# Patient Record
Sex: Male | Born: 1975 | Race: White | Hispanic: No | Marital: Married | Smoking: Current every day smoker
Health system: Southern US, Community
[De-identification: ages and names within clinical notes are randomized; demographics above are authoritative.]

## PROBLEM LIST (undated history)

## (undated) DIAGNOSIS — F419 Anxiety disorder, unspecified: Secondary | ICD-10-CM

## (undated) DIAGNOSIS — IMO0002 Reserved for concepts with insufficient information to code with codable children: Secondary | ICD-10-CM

## (undated) DIAGNOSIS — IMO0001 Reserved for inherently not codable concepts without codable children: Secondary | ICD-10-CM

## (undated) DIAGNOSIS — I10 Essential (primary) hypertension: Secondary | ICD-10-CM

## (undated) DIAGNOSIS — F17200 Nicotine dependence, unspecified, uncomplicated: Secondary | ICD-10-CM

## (undated) DIAGNOSIS — T7840XA Allergy, unspecified, initial encounter: Secondary | ICD-10-CM

## (undated) HISTORY — DX: Essential (primary) hypertension: I10

## (undated) HISTORY — DX: Nicotine dependence, unspecified, uncomplicated: F17.200

## (undated) HISTORY — DX: Allergy, unspecified, initial encounter: T78.40XA

## (undated) HISTORY — DX: Reserved for inherently not codable concepts without codable children: IMO0001

## (undated) HISTORY — PX: NO PAST SURGERIES: SHX2092

## (undated) HISTORY — DX: Anxiety disorder, unspecified: F41.9

## (undated) HISTORY — DX: Reserved for concepts with insufficient information to code with codable children: IMO0002

## (undated) NOTE — *Deleted (*Deleted)
Clinical Summary Curtis Snow is a 44 y.o.male  1. Palpitations/Chest pains   2. HTN    Past Medical History:  Diagnosis Date  . Anxiety   . DDD (degenerative disc disease)    saw Dr. Eduard Clos  . Hypertension   . Smoking      Allergies  Allergen Reactions  . Penicillins Shortness Of Breath, Nausea And Vomiting and Rash    Did it involve swelling of the face/tongue/throat, SOB, or low BP? Yes Did it involve sudden or severe rash/hives, skin peeling, or any reaction on the inside of your mouth or nose? Yes Did you need to seek medical attention at a hospital or doctor's office? Yes When did it last happen?childhood allergy If all above answers are "NO", may proceed with cephalosporin use.      Current Outpatient Medications  Medication Sig Dispense Refill  . ibuprofen (ADVIL) 200 MG tablet Take 400 mg by mouth every 6 (six) hours as needed for headache or moderate pain.    Marland Kitchen losartan (COZAAR) 25 MG tablet Take 1 tablet by mouth once daily 90 tablet 0  . ondansetron (ZOFRAN) 4 MG tablet Take 1 tablet (4 mg total) by mouth every 6 (six) hours. 12 tablet 0   No current facility-administered medications for this visit.     Past Surgical History:  Procedure Laterality Date  . COLONOSCOPY N/A 05/20/2019   Procedure: COLONOSCOPY;  Surgeon: Corbin Ade, MD;  Location: [facility];  Service: Endoscopy;  Laterality: N/A;  1:30pm  . NO PAST SURGERIES    . POLYPECTOMY  05/20/2019   Procedure: POLYPECTOMY;  Surgeon: Corbin Ade, MD;  Location: [facility];  Service: Endoscopy;;  hepatic flexure     Allergies  Allergen Reactions  . Penicillins Shortness Of Breath, Nausea And Vomiting and Rash    Did it involve swelling of the face/tongue/throat, SOB, or low BP? Yes Did it involve sudden or severe rash/hives, skin peeling, or any reaction on the inside of your mouth or nose? Yes Did you need to seek medical attention at a hospital or doctor's office? Yes  When did it last happen?childhood allergy If all above answers are "NO", may proceed with cephalosporin use.       Family History  Problem Relation Age of Onset  . Arthritis Father   . Hypertension Father   . Hyperlipidemia Father   . Asthma Son   . COPD Maternal Grandmother   . Colon cancer Maternal Grandfather   . Heart disease Paternal Grandfather   . Colon cancer Maternal Aunt   . Breast cancer Maternal Aunt      Social History Curtis Snow reports that he has been smoking cigarettes. He has been smoking about 1.00 pack per day. He has never used smokeless tobacco. Curtis Snow reports no history of alcohol use.   Review of Systems CONSTITUTIONAL: No weight loss, fever, chills, weakness or fatigue.  HEENT: Eyes: No visual loss, blurred vision, double vision or yellow sclerae.No hearing loss, sneezing, congestion, runny nose or sore throat.  SKIN: No rash or itching.  CARDIOVASCULAR:  RESPIRATORY: No shortness of breath, cough or sputum.  GASTROINTESTINAL: No anorexia, nausea, vomiting or diarrhea. No abdominal pain or blood.  GENITOURINARY: No burning on urination, no polyuria NEUROLOGICAL: No headache, dizziness, syncope, paralysis, ataxia, numbness or tingling in the extremities. No change in bowel or bladder control.  MUSCULOSKELETAL: No muscle, back pain, joint pain or stiffness.  LYMPHATICS: No enlarged nodes. No history  of splenectomy.  PSYCHIATRIC: No history of depression or anxiety.  ENDOCRINOLOGIC: No reports of sweating, cold or heat intolerance. No polyuria or polydipsia.  Marland Kitchen   Physical Examination There were no vitals filed for this visit. There were no vitals filed for this visit.  Gen: resting comfortably, no acute distress HEENT: no scleral icterus, pupils equal round and reactive, no palptable cervical adenopathy,  CV Resp: Clear to auscultation bilaterally GI: abdomen is soft, non-tender, non-distended, normal bowel sounds, no  hepatosplenomegaly MSK: extremities are warm, no edema.  Skin: warm, no rash Neuro:  no focal deficits Psych: appropriate affect   Diagnostic Studies     Assessment and Plan        Antoine Poche, M.D., F.A.C.C.

---

## 2001-03-11 ENCOUNTER — Emergency Department (HOSPITAL_COMMUNITY): Admission: EM | Admit: 2001-03-11 | Discharge: 2001-03-11 | Payer: Self-pay | Admitting: Emergency Medicine

## 2001-11-19 ENCOUNTER — Encounter: Payer: Self-pay | Admitting: *Deleted

## 2001-11-19 ENCOUNTER — Emergency Department (HOSPITAL_COMMUNITY): Admission: EM | Admit: 2001-11-19 | Discharge: 2001-11-19 | Payer: Self-pay | Admitting: *Deleted

## 2003-02-06 ENCOUNTER — Encounter: Payer: Self-pay | Admitting: Emergency Medicine

## 2003-02-06 ENCOUNTER — Emergency Department (HOSPITAL_COMMUNITY): Admission: EM | Admit: 2003-02-06 | Discharge: 2003-02-06 | Payer: Self-pay | Admitting: Emergency Medicine

## 2003-03-05 ENCOUNTER — Ambulatory Visit (HOSPITAL_COMMUNITY): Admission: RE | Admit: 2003-03-05 | Discharge: 2003-03-05 | Payer: Self-pay | Admitting: Preventative Medicine

## 2004-04-28 ENCOUNTER — Ambulatory Visit: Payer: Self-pay | Admitting: Internal Medicine

## 2004-05-06 ENCOUNTER — Ambulatory Visit (HOSPITAL_COMMUNITY): Admission: RE | Admit: 2004-05-06 | Discharge: 2004-05-06 | Payer: Self-pay | Admitting: Internal Medicine

## 2005-11-09 ENCOUNTER — Emergency Department (HOSPITAL_COMMUNITY): Admission: EM | Admit: 2005-11-09 | Discharge: 2005-11-09 | Payer: Self-pay | Admitting: Emergency Medicine

## 2006-09-11 ENCOUNTER — Emergency Department (HOSPITAL_COMMUNITY): Admission: EM | Admit: 2006-09-11 | Discharge: 2006-09-11 | Payer: Self-pay | Admitting: Emergency Medicine

## 2007-10-05 ENCOUNTER — Emergency Department (HOSPITAL_COMMUNITY): Admission: EM | Admit: 2007-10-05 | Discharge: 2007-10-05 | Payer: Self-pay | Admitting: Emergency Medicine

## 2008-02-13 ENCOUNTER — Emergency Department (HOSPITAL_COMMUNITY): Admission: EM | Admit: 2008-02-13 | Discharge: 2008-02-13 | Payer: Self-pay | Admitting: Emergency Medicine

## 2008-05-25 ENCOUNTER — Emergency Department (HOSPITAL_COMMUNITY): Admission: EM | Admit: 2008-05-25 | Discharge: 2008-05-25 | Payer: Self-pay | Admitting: General Surgery

## 2009-06-22 ENCOUNTER — Emergency Department (HOSPITAL_COMMUNITY): Admission: EM | Admit: 2009-06-22 | Discharge: 2009-06-23 | Payer: Self-pay | Admitting: Emergency Medicine

## 2010-08-15 LAB — DIFFERENTIAL
Eosinophils Relative: 6 % — ABNORMAL HIGH (ref 0–5)
Lymphocytes Relative: 25 % (ref 12–46)
Lymphs Abs: 1.9 10*3/uL (ref 0.7–4.0)
Monocytes Absolute: 0.5 10*3/uL (ref 0.1–1.0)
Neutro Abs: 4.8 10*3/uL (ref 1.7–7.7)

## 2010-08-15 LAB — CBC
HCT: 45.4 % (ref 39.0–52.0)
Hemoglobin: 15.3 g/dL (ref 13.0–17.0)
WBC: 7.7 10*3/uL (ref 4.0–10.5)

## 2010-08-15 LAB — BASIC METABOLIC PANEL
GFR calc Af Amer: >60 mL/min (ref >60)
GFR calc non Af Amer: >60 mL/min (ref >60)
Potassium: 3.3 mEq/L — ABNORMAL LOW (ref 3.5–5.1)
Sodium: 140 mEq/L (ref 135–145)

## 2010-08-15 LAB — POCT CARDIAC MARKERS
CKMB, poc: <1 ng/mL — ABNORMAL LOW (ref 1.0–8.0)
Troponin i, poc: <0.05 ng/mL (ref 0.00–0.09)

## 2010-09-16 NOTE — Consult Note (Signed)
NAME:  Curtis Snow, Curtis Snow            ACCOUNT NO.:  [id number]   MEDICAL RECORD NO.:  [id number]          PATIENT TYPE:   LOCATION:                                FACILITY:  [facility]   PHYSICIAN:  R. Roetta Sessions, M.D. DATE OF BIRTH:  [birth year]   DATE OF CONSULTATION:  DATE OF DISCHARGE:                                   CONSULTATION   GASTROINTESTINAL CONSULT:   REQUESTING PHYSICIAN:  Dr. Ishmael Holter. McInnis.   REASON FOR CONSULTATION:  Persistent diarrhea.   HISTORY OF PRESENT ILLNESS:  Curtis Snow is a 35 year old Caucasian male  who reports a 3- to 74-month history of persistent diarrhea.  He notes  between 2 and 7 episodes of loose watery to semiformed stools per day every  day.  He also has noticed intermittent deep red blood mixed in with the  stool as well.  He denies any known history of external hemorrhoids.  He  denies any melena or mucus in the stool.  He also is complaining of right  mid-abdominal pain which has been intermittent for 4 years now and denies  any nausea or vomiting, denies any fever or chills, denies any history of  constipation, denies any recent antibiotic use, foreign travel, new pets, or  new medications.  He did take 1 dose of prednisone for his left shoulder  pain, although this was discontinued.  He denies any history of heartburn,  indigestion, dysphagia or odynophagia.   PAST MEDICAL HISTORY:  Left shoulder injury.   CURRENT MEDICATIONS:  Denies any.   ALLERGIES:  PENICILLIN, which causes hives; PREDNISONE, which causes  palpitations.   FAMILY HISTORY:  No first-degree relatives with colorectal carcinoma or  inflammatory bowel disease.  He does have a maternal grandfather with colon  cancer diagnosed in his 23s.  Mother, age 47, and father, age 27, are both  relatively healthy except for father with coronary artery disease.  He has a  healthy sister and brother.   SOCIAL HISTORY:  Curtis Snow is currently divorced.  His father and brother  live with him.  He has one 60-year-old son who is healthy.  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  He reports an 18-pack-year  tobacco use history, reports a 1- to 2-year heavy alcohol history while he  was in the [employer].  He denies any current drug use.   REVIEW OF SYSTEMS:  CONSTITUTIONAL:  He reports a 10-pound weight loss over  the last 4 months, however, he has gradually begun to gain this weight back  per his report.  He denies any fever or chills.  Appetite is good.  Denies  any early satiety.  ENDOCRINE:  Denies any history of diabetes mellitus or  thyroid disease.  GI:  See HPI.   PHYSICAL EXAM:  VITAL SIGNS:  Weight 123 pounds.  Height 74 inches.  Temperature 98.1, blood pressure 100/66, pulse 88.  GENERAL:  Curtis Snow is a 35 year old well-developed, well-nourished  Caucasian male in no acute distress.  He is alert, oriented, pleasant and  cooperative.  HEENT:  Sclerae clear and nonicteric.  Conjunctivae  are pink.  Oropharynx is  pink and moist without any lesion.  NECK:  Neck is supple without mass or thyromegaly.  CHEST:  Heart:  Regular rate and rhythm with normal S1 and S2, without any  murmurs, rubs, or gallops.  Lungs are clear to auscultation bilaterally.  ABDOMEN:  Abdomen flat with positive bowel sounds x4.  No bruits  auscultated.  Soft, nontender, except for mild tenderness just right at the  umbilicus on deep palpation, no McBurney's point tenderness, no rebound  tenderness or guarding, no palpable hepatosplenomegaly or mass.  RECTAL:  No external lesions visualized.  Good sphincter tone.  No internal  masses palpated.  There was a small amount of light brown stool obtained  from the vault which is Hemoccult-negative.  EXTREMITIES:  Pedal pulses 2+ bilaterally.  No edema.  SKIN:  Skin pink, warm and dry without rash or jaundice.   IMPRESSION:  Curtis Snow is a 35 year old Caucasian male with a 31-month  history of persistent watery loose  diarrhea, anywhere between 2 and 7  episodes per day every day.  He also has noted intermittent hematochezia  with the diarrhea as well.  He also noted intermittent right mid-abdominal  pain.  We should rule out inflammatory bowel disease in Curtis Snow's case.  Otherwise, findings are consistent with irritable bowel syndrome, although  he notes no problems with diarrhea up until this time.  We should also rule  out infectious etiologies.   RECOMMENDATIONS:  1.  We will schedule colonoscopy with Dr. Jonathon Bellows in the near      future.  I have discussed the procedure including risks and benefits      that include, but are not limited to, bleeding, fissure, perforation or      drug reaction; he agrees with the plan and consent will be obtained.  2.  Labs today to include CBC, sed rate, CMP and a full set of stool      studies.  3.  Rx was given for Levsin sublingual to use up to t.i.d. during abdominal      cramping and diarrhea, #60 with no refills.  4.  Further recommendations pending colonoscopy.   COMMENT:  We would like to thank Dr. Renard Matter for allowing [place] to participate  in the care of Curtis Snow.     Kand   KC/MEDQ  D:  04/28/2004  T:  04/28/2004  Job:  [id number]   cc:   Angus G. Renard Matter, MD  [address]  [place]  [place] [address]  Fax: [phone]

## 2011-01-30 LAB — URINALYSIS, ROUTINE W REFLEX MICROSCOPIC
Ketones, ur: NEGATIVE
Nitrite: NEGATIVE
Specific Gravity, Urine: 1.01
Urobilinogen, UA: 0.2
pH: 6

## 2011-01-30 LAB — CBC
Hemoglobin: 15.5
MCHC: 34.3
Platelets: 206
RDW: 12.8

## 2011-06-16 ENCOUNTER — Ambulatory Visit: Payer: 59 | Attending: Otolaryngology | Admitting: Sleep Medicine

## 2011-06-16 DIAGNOSIS — G4733 Obstructive sleep apnea (adult) (pediatric): Secondary | ICD-10-CM | POA: Insufficient documentation

## 2011-06-16 DIAGNOSIS — G4730 Sleep apnea, unspecified: Secondary | ICD-10-CM

## 2011-06-24 NOTE — Procedures (Signed)
NAME:  Curtis Snow, Curtis Snow            ACCOUNT NO.:  [id number]  MEDICAL RECORD NO.:  [id number]          PATIENT TYPE:  OUT  LOCATION:  SLEEP LAB                     FACILITY:  [facility]  PHYSICIAN:  Jory Tanguma A. Gerilyn Pilgrim, M.D. DATE OF BIRTH:  07-27-1975  DATE OF STUDY:                           NOCTURNAL POLYSOMNOGRAM  REFERRING PHYSICIAN:  Antony Contras, MD  INDICATION:  A 36 year old man, who presents with fatigue and snoring. The study is being done to evaluate for obstructive sleep apnea syndrome.  MEDICATIONS:  None.  EPWORTH SLEEPINESS SCALE:  5.  BMI:  24.  ARCHITECTURAL SUMMARY:  The total recording time is 380 minutes.  Sleep efficiency 89%.  Sleep latency 31 minute.  REM latency 65 minutes. Stage N1 5%, N2 62%, N3 10%, and REM sleep 22%.  RESPIRATORY SUMMARY:  Baseline oxygen saturation is 96, lowest saturation 87 during non-REM sleep.  Diagnostic AHI 7 and RDI 8.  LIMB MOVEMENT SUMMARY:  PLM index 2.  ELECTROCARDIOGRAM SUMMARY:  Average heart rate is 68 with no significant dysrhythmias observed.  IMPRESSION:  Mild obstructive sleep apnea syndrome, not requiring positive pressure therapy.  Thanks for this referral.     Vienne Corcoran A. Gerilyn Pilgrim, M.D.    KAD/MEDQ  D:  06/24/2011 12:53:36  T:  06/24/2011 13:32:45  Job:  [id number]

## 2011-06-27 ENCOUNTER — Encounter (HOSPITAL_COMMUNITY): Payer: Self-pay | Admitting: *Deleted

## 2011-06-27 ENCOUNTER — Emergency Department (HOSPITAL_COMMUNITY)
Admission: EM | Admit: 2011-06-27 | Discharge: 2011-06-27 | Disposition: A | Discharge status: Home Health | Payer: 59 | Attending: Emergency Medicine | Admitting: Emergency Medicine

## 2011-06-27 DIAGNOSIS — F17200 Nicotine dependence, unspecified, uncomplicated: Secondary | ICD-10-CM | POA: Insufficient documentation

## 2011-06-27 DIAGNOSIS — M79609 Pain in unspecified limb: Secondary | ICD-10-CM | POA: Insufficient documentation

## 2011-06-27 DIAGNOSIS — M79673 Pain in unspecified foot: Secondary | ICD-10-CM

## 2011-06-27 NOTE — ED Notes (Signed)
Pt reports pain to rt heel for the past couple of months, reports pain has gotten worse

## 2011-06-27 NOTE — Discharge Instructions (Signed)
Use motrin as needed--follow up with a podiatrist

## 2011-06-27 NOTE — ED Provider Notes (Signed)
History    This chart was scribed for Curtis Baker, MD, MD by Smitty Pluck. The patient was seen in room [facility]19 and the patient's care was started at 10:30PM.   CSN: [id number]  Arrival date & time 06/27/11  2219   None     Chief Complaint  Patient presents with  . Foot Pain    (Consider location/radiation/quality/duration/timing/severity/associated sxs/prior treatment) Patient is a 36 y.o. male presenting with lower extremity pain. The history is provided by the patient.  Foot Pain   Curtis Snow is a 36 y.o. male who presents to the Emergency Department complaining of intermittent moderate right heel pain onset 2-3 months ago. There is no radiation of pain. Pt reports that his job involves standing and he has to wear boots. Pt reports the pain is alleviated by resting while not at work. Pain is aggravated while at work standing and moving. Pt has not taken much medicine for pain besides an aspirin with no relief. Pt denies diabetes, HTN or any other health problems.   History reviewed. No pertinent past medical history.  History reviewed. No pertinent past surgical history.  No family history on file.  History  Substance Use Topics  . Smoking status: Current Everyday Smoker  . Smokeless tobacco: Not on file  . Alcohol Use: No      Review of Systems  All other systems reviewed and are negative.   10 Systems reviewed and are negative for acute change except as noted in the HPI.  Allergies  Penicillins  Home Medications  No current outpatient prescriptions on file.  BP 151/94  Pulse 86  Temp(Src) 97.7 F (36.5 C) (Oral)  Resp 20  Ht 6\' 2"  (1.88 m)  Wt 185 lb (83.915 kg)  BMI 23.75 kg/m2  SpO2 100%  Physical Exam  Nursing note and vitals reviewed. Constitutional: He is oriented to person, place, and time. He appears well-developed and well-nourished. No distress.  HENT:  Head: Normocephalic and atraumatic.  Eyes: EOM are normal. Pupils are equal,  round, and reactive to light.  Neck: Normal range of motion. Neck supple. No tracheal deviation present.  Cardiovascular: Normal rate, regular rhythm and normal heart sounds.   Pulmonary/Chest: Effort normal. No respiratory distress.  Abdominal: Soft. He exhibits no distension.  Musculoskeletal: Normal range of motion.       Right heel tenderness  flexion and extension nl with pain  no bruising or erythema Skin intact Non-tender on achilles tendon   Neurological: He is alert and oriented to person, place, and time.  Skin: Skin is warm and dry.  Psychiatric: He has a normal mood and affect. His behavior is normal.    ED Course  Procedures (including critical care time) DIAGNOSTIC STUDIES: Oxygen Saturation is 100% on room air, normal by my interpretation.    COORDINATION OF CARE: 10:40PM EDp discusses pt ED treatment course with pt and future treatment course.   Labs Reviewed - No data to display No results found.   No diagnosis found.    MDM  I personally performed the services described in this documentation, which was scribed in my presence. The recorded information has been reviewed and considered.          Curtis Baker, MD 06/27/11 2242

## 2011-10-05 ENCOUNTER — Emergency Department (HOSPITAL_COMMUNITY)
Admission: EM | Admit: 2011-10-05 | Discharge: 2011-10-05 | Disposition: A | Discharge status: Home / Self Care | Payer: 59 | Attending: Emergency Medicine | Admitting: Emergency Medicine

## 2011-10-05 ENCOUNTER — Encounter (HOSPITAL_COMMUNITY): Payer: Self-pay

## 2011-10-05 DIAGNOSIS — R002 Palpitations: Secondary | ICD-10-CM | POA: Insufficient documentation

## 2011-10-05 DIAGNOSIS — R0789 Other chest pain: Secondary | ICD-10-CM | POA: Insufficient documentation

## 2011-10-05 DIAGNOSIS — F17200 Nicotine dependence, unspecified, uncomplicated: Secondary | ICD-10-CM | POA: Insufficient documentation

## 2011-10-05 NOTE — ED Provider Notes (Signed)
History   This chart was scribed for Doug Sou, MD by Brooks Sailors. The patient was seen in room [facility]11/[facility]. Patient's care was started at 1158.   CSN: [id number]  Arrival date & time 10/05/11  1158   First MD Initiated Contact with Patient 10/05/11 1337      Chief Complaint  Patient presents with  . Chest Pain    (Consider location/radiation/quality/duration/timing/severity/associated sxs/prior treatment) HPI  Curtis Snow is a 37 y.o. male who presents to the Emergency Department complaining of constant chest pain onset over a year. Pt says it started "not very severe" with occasional heart "skipping a beat". Pt says it has become more severe this week with , with the longest episode of skipped heartbeats lasting 5 seconds. He also reports left-sided. Sternal chest pain which has been constant for approximately one year no treatment prior to coming here. Nothing makes symptoms better or worse symptoms nonexertional. No shortness of breath nausea or sweatiness. Admits to frequent caffeinated beverages episodes of sharper pain. Says the pain feels like someone is stepping on him. Pt says he may have high cholesterol, recommended to take fish oil pills and is a smoker. Father has arrythmia. No history    History reviewed. No pertinent past medical history. Questionable hypercholesterolemia History reviewed. No pertinent past surgical history.  No family history on file. Family his Coronary artery disease in grandfather History  Substance Use Topics  . Smoking status: Current Everyday Smoker    Types: Cigarettes  . Smokeless tobacco: Not on file  . Alcohol Use: No      Review of Systems  Constitutional: Negative.   HENT: Negative.   Respiratory: Negative.   Cardiovascular: Positive for chest pain and palpitations.  Gastrointestinal: Negative.   Musculoskeletal: Negative.   Skin: Negative.   Neurological: Negative.   Hematological: Negative.     Psychiatric/Behavioral: Negative.     Allergies  Penicillins  Home Medications  No current outpatient prescriptions on file.  BP 136/85  Pulse 80  Temp(Src) 98.4 F (36.9 C) (Oral)  Resp 20  Ht 6\' 2"  (1.88 m)  Wt 190 lb (86.183 kg)  BMI 24.39 kg/m2  SpO2 97%  Physical Exam  Nursing note and vitals reviewed. Constitutional: He appears well-developed and well-nourished.  HENT:  Head: Normocephalic and atraumatic.  Eyes: Conjunctivae are normal. Pupils are equal, round, and reactive to light.  Neck: Neck supple. No tracheal deviation present. No thyromegaly present.  Cardiovascular: Normal rate and regular rhythm.   No murmur heard. Pulmonary/Chest: Effort normal and breath sounds normal.  Abdominal: Soft. Bowel sounds are normal. He exhibits no distension. There is no tenderness.  Musculoskeletal: Normal range of motion. He exhibits no edema and no tenderness.  Neurological: He is alert. Coordination normal.  Skin: Skin is warm and dry. No rash noted.  Psychiatric: He has a normal mood and affect.    Date: 10/05/2011  Rate: 80  Rhythm: normal sinus rhythm  QRS Axis: normal  Intervals: normal  ST/T Wave abnormalities: normal  Conduction Disutrbances: none  Narrative Interpretation: unremarkable    ED Course  Procedures (including critical care time) DIAGNOSTIC STUDIES: Oxygen Saturation is 97% on room air, normal by my interpretation.    COORDINATION OF CARE: 1345 Patient informed of current plan for treatment and evaluation and agrees with plan at this time.       Labs Reviewed - No data to display No results found.   No diagnosis found.    MDM  Strongly  doubt acute coronary syndrome highly atypical symptoms with palpitations lasting only for 5 seconds at a time shortness of breath lasting only a few seconds at a time symptoms nonexertional. Plan followup Dr. Tanya Nones. Possible event monitor Patient instructed to avoid caffeine Smoking cessation  urged spent 5 minutes with patient on topic of smoking cessation Diagnosis #1 atypical chest pain #2 palpitations #3 tobacco abuse       I personally performed the services described in this documentation, which was scribed in my presence. The recorded information has been reviewed and considered.  Doug Sou, MD 10/05/11 1359

## 2011-10-05 NOTE — ED Notes (Signed)
Pt a/ox4. resp even and unlabored. NAD at this time. D/C instructions reviewed with pt. Pt verbalized understanding. Pt ambulated to lobby with steady gate.  

## 2011-10-05 NOTE — Discharge Instructions (Signed)
Palpitations  Avoid [employer] and BEVERAGES. Ask Dr. Tanya Nones to help you to stop smoking. Call Dr. Tanya Nones tomorrow to arrange an office visit. He may want to place you on a heart monitor to monitor your heartbeat A palpitation is the feeling that your heartbeat is irregular or is faster than normal. Although this is frightening, it usually is not serious. Palpitations may be caused by excesses of smoking, caffeine, or alcohol. They are also brought on by stress and anxiety. Sometimes, they are caused by heart disease. Unless otherwise noted, your caregiver did not find any signs of serious illness at this time. HOME CARE INSTRUCTIONS  To help prevent palpitations:  Drink decaffeinated coffee, tea, and soda pop. Avoid chocolate.   If you smoke or drink alcohol, quit or cut down as much as possible.   Reduce your stress or anxiety level. Biofeedback, yoga, or meditation will help you relax. Physical activity such as swimming, jogging, or walking also may be helpful.  SEEK MEDICAL CARE IF:   You continue to have a fast heartbeat.   Your palpitations occur more often.  SEEK IMMEDIATE MEDICAL CARE IF: You develop chest pain, shortness of breath, severe headache, dizziness, or fainting. Document Released: 04/14/2000 Document Revised: 04/06/2011 Document Reviewed: 06/14/2007 [facility] Patient Information 2012 [place], [place].

## 2011-10-05 NOTE — ED Notes (Signed)
Pt has been having "tap dancing in my chest" off/on for last year, over the last week having more freq. Episodes that have been "taking my breath away", and sob at times. Weak and dizzy at times.

## 2012-09-26 ENCOUNTER — Other Ambulatory Visit (HOSPITAL_COMMUNITY): Payer: Self-pay | Admitting: Physical Medicine and Rehabilitation

## 2012-09-26 DIAGNOSIS — M47817 Spondylosis without myelopathy or radiculopathy, lumbosacral region: Secondary | ICD-10-CM

## 2012-09-26 DIAGNOSIS — IMO0002 Reserved for concepts with insufficient information to code with codable children: Secondary | ICD-10-CM

## 2012-09-26 DIAGNOSIS — IMO0001 Reserved for inherently not codable concepts without codable children: Secondary | ICD-10-CM

## 2012-09-26 DIAGNOSIS — M5126 Other intervertebral disc displacement, lumbar region: Secondary | ICD-10-CM

## 2012-09-26 DIAGNOSIS — M545 Low back pain: Secondary | ICD-10-CM

## 2012-09-30 ENCOUNTER — Ambulatory Visit (HOSPITAL_COMMUNITY)
Admission: RE | Admit: 2012-09-30 | Discharge: 2012-09-30 | Disposition: A | Discharge status: Home / Self Care | Payer: BC Managed Care – PPO | Source: Ambulatory Visit | Attending: Physical Medicine and Rehabilitation | Admitting: Physical Medicine and Rehabilitation

## 2012-09-30 ENCOUNTER — Other Ambulatory Visit (HOSPITAL_COMMUNITY): Payer: Self-pay | Admitting: Physical Medicine and Rehabilitation

## 2012-09-30 DIAGNOSIS — Z1389 Encounter for screening for other disorder: Secondary | ICD-10-CM

## 2012-09-30 DIAGNOSIS — M5126 Other intervertebral disc displacement, lumbar region: Secondary | ICD-10-CM

## 2012-09-30 DIAGNOSIS — M47817 Spondylosis without myelopathy or radiculopathy, lumbosacral region: Secondary | ICD-10-CM

## 2012-09-30 DIAGNOSIS — M5124 Other intervertebral disc displacement, thoracic region: Secondary | ICD-10-CM | POA: Insufficient documentation

## 2012-09-30 DIAGNOSIS — IMO0002 Reserved for concepts with insufficient information to code with codable children: Secondary | ICD-10-CM

## 2012-09-30 DIAGNOSIS — IMO0001 Reserved for inherently not codable concepts without codable children: Secondary | ICD-10-CM

## 2012-09-30 DIAGNOSIS — M545 Low back pain, unspecified: Secondary | ICD-10-CM | POA: Insufficient documentation

## 2013-09-25 ENCOUNTER — Encounter: Payer: Self-pay | Admitting: *Deleted

## 2013-10-24 ENCOUNTER — Ambulatory Visit (INDEPENDENT_AMBULATORY_CARE_PROVIDER_SITE_OTHER): Payer: BC Managed Care – PPO | Admitting: Family Medicine

## 2013-10-24 ENCOUNTER — Encounter: Payer: Self-pay | Admitting: Family Medicine

## 2013-10-24 VITALS — BP 130/86 | HR 78 | Temp 97.7°F | Resp 12 | Ht 74.0 in | Wt 178.0 lb

## 2013-10-24 DIAGNOSIS — F17200 Nicotine dependence, unspecified, uncomplicated: Secondary | ICD-10-CM | POA: Insufficient documentation

## 2013-10-24 DIAGNOSIS — Z0000 Encounter for general adult medical examination without abnormal findings: Secondary | ICD-10-CM

## 2013-10-24 NOTE — Progress Notes (Signed)
Subjective:    Patient ID: Curtis Snow, male    DOB: 1975-05-11, 38 y.o.   MRN: [id number]  HPI Patient is a 38 year old white male who is here today for complete physical exam. He smokes one to 2 packs of cigarettes per day. He is interested in smoking cessation. He has no other medical concerns. His blood pressure is borderline elevated. He has had hypertension in the past. He denies any chest pain shortness of breath or dyspnea on exertion. Past Medical History  Diagnosis Date  . Anxiety   . Hypertension   . Smoking   . DDD (degenerative disc disease)     saw Dr. Ace Gins   No past surgical history on file. No current outpatient prescriptions on file prior to visit.   No current facility-administered medications on file prior to visit.   Allergies  Allergen Reactions  . Penicillins Nausea And Vomiting and Rash   History   Social History  . Marital Status: Married    Spouse Name: N/A    Number of Children: N/A  . Years of Education: N/A   Occupational History  . Not on file.   Social History Main Topics  . Smoking status: Current Every Day Smoker -- 1.50 packs/day    Types: Cigarettes  . Smokeless tobacco: Never Used  . Alcohol Use: No  . Drug Use: No  . Sexual Activity: Yes    Birth Control/ Protection: None   Other Topics Concern  . Not on file   Social History Narrative  . No narrative on file   Family History  Problem Relation Age of Onset  . Arthritis Father   . Hypertension Father   . Hyperlipidemia Father   . Asthma Son   . COPD Maternal Grandmother   . Cancer Maternal Grandfather     colon  . Heart disease Paternal Grandfather       Review of Systems  All other systems reviewed and are negative.      Objective:   Physical Exam  Vitals reviewed. Constitutional: He is oriented to person, place, and time. He appears well-developed and well-nourished. No distress.  HENT:  Head: Normocephalic and atraumatic.  Right Ear: External  ear normal.  Left Ear: External ear normal.  Nose: Nose normal.  Mouth/Throat: Oropharynx is clear and moist. No oropharyngeal exudate.  Eyes: Conjunctivae and EOM are normal. Pupils are equal, round, and reactive to light. Right eye exhibits no discharge. Left eye exhibits no discharge. No scleral icterus.  Neck: Normal range of motion. Neck supple. No JVD present. No tracheal deviation present. No thyromegaly present.  Cardiovascular: Normal rate, regular rhythm and normal heart sounds.  Exam reveals no gallop and no friction rub.   No murmur heard. Pulmonary/Chest: Effort normal and breath sounds normal. No stridor. No respiratory distress. He has no wheezes. He has no rales. He exhibits no tenderness.  Abdominal: Soft. Bowel sounds are normal. He exhibits no distension and no mass. There is no tenderness. There is no rebound and no guarding.  Musculoskeletal: Normal range of motion. He exhibits no edema and no tenderness.  Lymphadenopathy:    He has no cervical adenopathy.  Neurological: He is alert and oriented to person, place, and time. He has normal reflexes. He displays normal reflexes. No cranial nerve deficit. He exhibits normal muscle tone. Coordination normal.  Skin: Skin is warm. No rash noted. He is not diaphoretic. No erythema. No pallor.  Psychiatric: He has a normal mood and affect. His  behavior is normal. Judgment and thought content normal.          Assessment & Plan:  1. Routine general medical examination at a health care facility I have asked the patient to check his blood pressure daily for one week and then allow me to review the records. If his blood pressure is consistently greater than 140/90 I discussed medication with the patient. I have recommended Chantix for smoking cessation. We had a long discussion regarding quitting strategies. I gave the patient a sample for a starter pack of Chantix. If the patient tolerates the starter pack, gladly refill the continuing  dose of 1 mg twice a day for up to 3 months.  Patient also received a tetanus vaccine today in the clinic. I will check a CBC, CMP, fasting lipid panel. - CBC with Differential - COMPLETE METABOLIC PANEL WITH GFR - Lipid panel

## 2013-10-25 LAB — COMPLETE METABOLIC PANEL WITH GFR
ALK PHOS: 66 U/L (ref 39–117)
ALT: 16 U/L (ref 0–53)
AST: 19 U/L (ref 0–37)
Albumin: 4.4 g/dL (ref 3.5–5.2)
BUN: 13 mg/dL (ref 6–23)
CALCIUM: 9.2 mg/dL (ref 8.4–10.5)
CO2: 25 mEq/L (ref 19–32)
CREATININE: 0.9 mg/dL (ref 0.50–1.35)
Chloride: 105 mEq/L (ref 96–112)
GFR, Est African American: >89 mL/min
GFR, Est Non African American: >89 mL/min
Glucose, Bld: 83 mg/dL (ref 70–99)
Potassium: 4 mEq/L (ref 3.5–5.3)
Sodium: 139 mEq/L (ref 135–145)
Total Bilirubin: 0.9 mg/dL (ref 0.2–1.2)
Total Protein: 6.7 g/dL (ref 6.0–8.3)

## 2013-10-25 LAB — LIPID PANEL
CHOL/HDL RATIO: 4.7 ratio
CHOLESTEROL: 155 mg/dL (ref 0–200)
HDL: 33 mg/dL — ABNORMAL LOW (ref >39)
LDL Cholesterol: 93 mg/dL (ref 0–99)
TRIGLYCERIDES: 147 mg/dL (ref <150)
VLDL: 29 mg/dL (ref 0–40)

## 2013-10-25 LAB — CBC WITH DIFFERENTIAL/PLATELET
BASOS ABS: 0 10*3/uL (ref 0.0–0.1)
BASOS PCT: 0 % (ref 0–1)
EOS ABS: 0.3 10*3/uL (ref 0.0–0.7)
EOS PCT: 4 % (ref 0–5)
HEMATOCRIT: 43.2 % (ref 39.0–52.0)
Hemoglobin: 15.9 g/dL (ref 13.0–17.0)
Lymphocytes Relative: 30 % (ref 12–46)
Lymphs Abs: 2.6 10*3/uL (ref 0.7–4.0)
MCH: 31.4 pg (ref 26.0–34.0)
MCHC: 36.8 g/dL — AB (ref 30.0–36.0)
MCV: 85.4 fL (ref 78.0–100.0)
MONO ABS: 0.7 10*3/uL (ref 0.1–1.0)
Monocytes Relative: 8 % (ref 3–12)
Neutro Abs: 5 10*3/uL (ref 1.7–7.7)
Neutrophils Relative %: 58 % (ref 43–77)
Platelets: 239 10*3/uL (ref 150–400)
RBC: 5.06 MIL/uL (ref 4.22–5.81)
RDW: 13.7 % (ref 11.5–15.5)
WBC: 8.7 10*3/uL (ref 4.0–10.5)

## 2013-11-07 ENCOUNTER — Ambulatory Visit (INDEPENDENT_AMBULATORY_CARE_PROVIDER_SITE_OTHER): Payer: BC Managed Care – PPO | Admitting: Family Medicine

## 2013-11-07 ENCOUNTER — Encounter: Payer: Self-pay | Admitting: Family Medicine

## 2013-11-07 VITALS — BP 128/78 | HR 86 | Temp 98.8°F | Resp 16 | Ht 74.0 in | Wt 181.0 lb

## 2013-11-07 DIAGNOSIS — L723 Sebaceous cyst: Secondary | ICD-10-CM

## 2013-11-07 NOTE — Progress Notes (Signed)
   Subjective:    Patient ID: Curtis Snow, male    DOB: [birth year], 38 y.o.   MRN: [id number]  HPI  Patient has a sebaceous cyst on his right lower back.  It is 2.5 x 2 cm in size. He is requesting removal of this lesion. Past Medical History  Diagnosis Date  . Anxiety   . Hypertension   . Smoking   . DDD (degenerative disc disease)     saw Dr. Ace Gins   No current outpatient prescriptions on file prior to visit.   No current facility-administered medications on file prior to visit.   Allergies  Allergen Reactions  . Penicillins Nausea And Vomiting and Rash   History   Social History  . Marital Status: Married    Spouse Name: N/A    Number of Children: N/A  . Years of Education: N/A   Occupational History  . Not on file.   Social History Main Topics  . Smoking status: Current Every Day Smoker -- 1.50 packs/day    Types: Cigarettes  . Smokeless tobacco: Never Used  . Alcohol Use: No  . Drug Use: No  . Sexual Activity: Yes    Birth Control/ Protection: None   Other Topics Concern  . Not on file   Social History Narrative  . No narrative on file     Review of Systems  All other systems reviewed and are negative.      Objective:   Physical Exam  Vitals reviewed. Cardiovascular: Normal rate and regular rhythm.   Pulmonary/Chest: Effort normal and breath sounds normal.  2.5x2 cm Sebaceous cyst on right lower back below the 12th rib.        Assessment & Plan:  1. Sebaceous cyst The area was anesthetized with 0.1% lidocaine with epinephrine. It was then prepped and draped in sterile fashion. A 3 cm x 2 cm elliptical excision was performed of the entire cyst sac down to the subcutaneous fat.  The subcutaneous fascia was then approximated using 3, 3-0 Vicryl sutures.  The skin edges were then approximated using 5, 3-0 Ethilon simple interrupted sutures.  Patient tolerated procedure well with minimal blood loss. Recommended he return in 7-10 days for  suture removal. Wound care was discussed. Follow up immediately if competitions arise.

## 2013-11-07 NOTE — Progress Notes (Signed)
   Subjective:    Patient ID: [name], male    DOB: Jul 24, 1975, 38 y.o.   MRN: [id number]  HPI  Patient had a vasovagal episode at check out.  He became clammy, diaphoretic and lightheaded. He then passed out as he was being lowered to the floor.  He did not fall. My staff was able to catch the patient and let him gently to the floor. He was placed in Trendelenburg position. He immediately regained consciousness. His color rapidly improved. He was then monitored in the office for 30 minutes. His color returned to normal. His blood pressure is 140/90. His heart rate is normal and strong at 76 beats per minute. He was in normal sinus rhythm on examination. The patient felt fine afterward and was able to leave the office without complication.  Review of Systems     Objective:   Physical Exam        Assessment & Plan:

## 2013-11-17 ENCOUNTER — Encounter: Payer: Self-pay | Admitting: Family Medicine

## 2013-11-17 ENCOUNTER — Ambulatory Visit (INDEPENDENT_AMBULATORY_CARE_PROVIDER_SITE_OTHER): Payer: BC Managed Care – PPO | Admitting: Family Medicine

## 2013-11-17 VITALS — BP 110/76 | HR 76 | Temp 98.0°F | Resp 16 | Ht 74.0 in | Wt 178.0 lb

## 2013-11-17 DIAGNOSIS — Z4802 Encounter for removal of sutures: Secondary | ICD-10-CM

## 2013-11-17 NOTE — Progress Notes (Signed)
   Subjective:    Patient ID: Curtis Snow, male    DOB: May 09, 1975, 38 y.o.   MRN: [id number]  HPI  11/07/13 Patient has a sebaceous cyst on his right lower back.  It is 2.5 x 2 cm in size. He is requesting removal of this lesion.  At that time, my plan was: 1. Sebaceous cyst The area was anesthetized with 0.1% lidocaine with epinephrine. It was then prepped and draped in sterile fashion. A 3 cm x 2 cm elliptical excision was performed of the entire cyst sac down to the subcutaneous fat.  The subcutaneous fascia was then approximated using 3, 3-0 Vicryl sutures.  The skin edges were then approximated using 5, 3-0 Ethilon simple interrupted sutures.  Patient tolerated procedure well with minimal blood loss. Recommended he return in 7-10 days for suture removal. Wound care was discussed. Follow up immediately if competitions arise.  11/17/13 Patient is here today for suture removal. Surgical incision is completely healed. There is no evidence of cellulitis. There is mild erythema around the surgical site from the pieces of tape and bandages the patient has been using. Past Medical History  Diagnosis Date  . Anxiety   . Hypertension   . Smoking   . DDD (degenerative disc disease)     saw Dr. Ace Gins   No current outpatient prescriptions on file prior to visit.   No current facility-administered medications on file prior to visit.   Allergies  Allergen Reactions  . Penicillins Nausea And Vomiting and Rash   History   Social History  . Marital Status: Married    Spouse Name: N/A    Number of Children: N/A  . Years of Education: N/A   Occupational History  . Not on file.   Social History Main Topics  . Smoking status: Current Every Day Smoker -- 1.50 packs/day    Types: Cigarettes  . Smokeless tobacco: Never Used  . Alcohol Use: No  . Drug Use: No  . Sexual Activity: Yes    Birth Control/ Protection: None   Other Topics Concern  . Not on file   Social History  Narrative  . No narrative on file     Review of Systems  All other systems reviewed and are negative.      Objective:   Physical Exam  Vitals reviewed. Cardiovascular: Normal rate and regular rhythm.   Pulmonary/Chest: Effort normal and breath sounds normal.   well-healed surgical incision with no evidence of cellulitis.        Assessment & Plan:  Visit for suture removal- Incision is completely healed. 5 sutures are removed without difficulty. The wound is reinforced with Steri-Strips. The patient tolerated the procedure without complication. Wound care is discussed.

## 2013-12-15 ENCOUNTER — Telehealth: Payer: Self-pay | Admitting: Family Medicine

## 2013-12-15 NOTE — Telephone Encounter (Signed)
NTBS.

## 2013-12-15 NOTE — Telephone Encounter (Signed)
PT had stitches about a month ago in his back that were inside and outside stitches, he came and got the outside stithces out, and something about stitches that were in the inside and it is having itching/pain if it hits the right way.  [phone]

## 2013-12-16 NOTE — Telephone Encounter (Signed)
LMOVM that pt would need to be seen in order to determine what it is exactly causing his pain at the incision site. Informed pt to call our office back and set up appt.

## 2013-12-18 ENCOUNTER — Emergency Department (HOSPITAL_COMMUNITY)
Admission: EM | Admit: 2013-12-18 | Discharge: 2013-12-18 | Disposition: A | Discharge status: Home / Self Care | Payer: BC Managed Care – PPO | Attending: Emergency Medicine | Admitting: Emergency Medicine

## 2013-12-18 ENCOUNTER — Encounter (HOSPITAL_COMMUNITY): Payer: Self-pay | Admitting: Emergency Medicine

## 2013-12-18 DIAGNOSIS — Z8739 Personal history of other diseases of the musculoskeletal system and connective tissue: Secondary | ICD-10-CM | POA: Insufficient documentation

## 2013-12-18 DIAGNOSIS — H5710 Ocular pain, unspecified eye: Secondary | ICD-10-CM | POA: Insufficient documentation

## 2013-12-18 DIAGNOSIS — H16139 Photokeratitis, unspecified eye: Secondary | ICD-10-CM | POA: Insufficient documentation

## 2013-12-18 DIAGNOSIS — Z792 Long term (current) use of antibiotics: Secondary | ICD-10-CM | POA: Insufficient documentation

## 2013-12-18 DIAGNOSIS — H16133 Photokeratitis, bilateral: Secondary | ICD-10-CM

## 2013-12-18 DIAGNOSIS — I10 Essential (primary) hypertension: Secondary | ICD-10-CM | POA: Insufficient documentation

## 2013-12-18 DIAGNOSIS — F17200 Nicotine dependence, unspecified, uncomplicated: Secondary | ICD-10-CM | POA: Insufficient documentation

## 2013-12-18 DIAGNOSIS — Z791 Long term (current) use of non-steroidal anti-inflammatories (NSAID): Secondary | ICD-10-CM | POA: Insufficient documentation

## 2013-12-18 DIAGNOSIS — Z880 Allergy status to penicillin: Secondary | ICD-10-CM | POA: Insufficient documentation

## 2013-12-18 DIAGNOSIS — Z8659 Personal history of other mental and behavioral disorders: Secondary | ICD-10-CM | POA: Insufficient documentation

## 2013-12-18 MED ORDER — FLUORESCEIN SODIUM 1 MG OP STRP
1.0000 | ORAL_STRIP | Freq: Once | OPHTHALMIC | Status: AC
  Administered 2013-12-18: 1 via OPHTHALMIC

## 2013-12-18 MED ORDER — HYDROCODONE-ACETAMINOPHEN 5-325 MG PO TABS: 1.0000 | ORAL_TABLET | Freq: Four times a day (QID) | ORAL | Status: DC | PRN

## 2013-12-18 MED ORDER — NAPROXEN 250 MG PO TABS
500.0000 mg | ORAL_TABLET | Freq: Two times a day (BID) | ORAL | Status: DC
  Administered 2013-12-18: 500 mg via ORAL
  Filled 2013-12-18: qty 2

## 2013-12-18 MED ORDER — ERYTHROMYCIN 5 MG/GM OP OINT: TOPICAL_OINTMENT | OPHTHALMIC | Status: DC

## 2013-12-18 MED ORDER — HYDROCODONE-ACETAMINOPHEN 5-325 MG PO TABS
1.0000 | ORAL_TABLET | Freq: Once | ORAL | Status: AC
  Administered 2013-12-18: 1 via ORAL
  Filled 2013-12-18: qty 1

## 2013-12-18 MED ORDER — FLUORESCEIN SODIUM 1 MG OP STRP
ORAL_STRIP | OPHTHALMIC | Status: AC
  Administered 2013-12-18: 1 via OPHTHALMIC
  Filled 2013-12-18: qty 1

## 2013-12-18 MED ORDER — NAPROXEN 500 MG PO TABS: 500.0000 mg | ORAL_TABLET | Freq: Two times a day (BID) | ORAL | Status: DC

## 2013-12-18 MED ORDER — TETRACAINE HCL 0.5 % OP SOLN
1.0000 [drp] | Freq: Once | OPHTHALMIC | Status: AC
  Administered 2013-12-18: 1 [drp] via OPHTHALMIC
  Filled 2013-12-18: qty 2
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
## 2013-12-18 NOTE — ED Notes (Signed)
nad noted prior to dc. Dc instructions reviewed and explained. Voiced understanding. Scripts given along with work note as well.

## 2013-12-18 NOTE — Discharge Instructions (Signed)
Ultraviolet Keratitis Ultraviolet keratitis can occur when too much UV light enters the cornea. The cornea is the clear cover on the front part of your eye that helps focus light. It protects your eyes from dust and other foreign bodies and filters ultraviolet rays. This condition can be caused by exposure to snow (snow blindness) from the reflected or direct sunlight. It may also be caused by exposure to welding arcs or halogen lamps (flash burn) and prolonged exposure to direct sunlight. Brief exposure can result in severe problems several hours later. CAUSES  Causes of ultraviolet keratitis include:  Exposure to snow (snow blindness) from the reflected or direct sunlight.  Exposure to welding arcs or halogen lamps (flash burn).  Prolonged exposure to direct sunlight. SYMPTOMS  The symptoms of ultraviolet keratitis usually start 6 to 12 hours after the damage occurred. They may include the following:   Tearing.  Light sensitivity.  Gritty sensation in eyes.  Swelling of your eyelids.  Severe pain. In spite of the pain, this condition will usually improve within 24 hours even without treatment. HOME CARE INSTRUCTIONS   Apply cold packs on your eyes to ease the pain.  Only take over-the-counter or prescription medicines for pain, discomfort, or fever as directed by your caregiver.  Your caregiver may also prescribe an antibiotic ointment to help prevent infection and/or additional medications for pain relief.  Apply an eye patch to help relieve discomfort and assist in healing. If your caregiver patches your eyes, it is important to leave these patches on.  Follow the instructions given to you by your caregiver.  Do not rub your eyes.  If your caregiver has given you a follow-up appointment, it is very important to keep that appointment. Not keeping the appointment could result in a severe eye infection or permanent loss of vision. If there is any problem keeping the appointment,  you must call back to this facility for assistance. SEEK IMMEDIATE MEDICAL CARE IF:   Pain is severe and not relieved by medication.  Pain or problems with vision last more than 48 hours.  Exposure to light is unavoidable and you need extra protection for your eyes. MAKE SURE YOU:   Understand these instructions.  Will watch your condition.  Will get help right away if you are not doing well or get worse. Document Released: 04/17/2005 Document Revised: 09/01/2013 Document Reviewed: 11/22/2007 [facility] Patient Information 2015 [place], [place]. This information is not intended to replace advice given to you by your health care provider. Make sure you discuss any questions you have with your health care provider.  Using a by appointment as directed. Take the Naprosyn on a regular basis. Supplement with hydrocodone as needed. Would expect her eyes to be improving significantly in 24 hours if they are not you'll need to followup. He states he do not wear contacts but if you do keep them out. Work note provided.
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
## 2013-12-19 ENCOUNTER — Encounter: Payer: Self-pay | Admitting: Family Medicine

## 2013-12-19 ENCOUNTER — Ambulatory Visit (INDEPENDENT_AMBULATORY_CARE_PROVIDER_SITE_OTHER): Payer: Self-pay | Admitting: Family Medicine

## 2013-12-19 VITALS — BP 126/86 | HR 74 | Temp 98.0°F | Resp 18 | Ht 74.0 in | Wt 177.0 lb

## 2013-12-19 DIAGNOSIS — T8189XA Other complications of procedures, not elsewhere classified, initial encounter: Secondary | ICD-10-CM

## 2013-12-19 DIAGNOSIS — IMO0002 Reserved for concepts with insufficient information to code with codable children: Secondary | ICD-10-CM

## 2013-12-19 NOTE — Progress Notes (Signed)
   Subjective:    Patient ID: Curtis Snow, male    DOB: 02-03-1976, 38 y.o.   MRN: [id number]  HPI  11/07/13 Patient has a sebaceous cyst on his right lower back.  It is 2.5 x 2 cm in size. He is requesting removal of this lesion. 1. Sebaceous cyst The area was anesthetized with 0.1% lidocaine with epinephrine. It was then prepped and draped in sterile fashion. A 3 cm x 2 cm elliptical excision was performed of the entire cyst sac down to the subcutaneous fat.  The subcutaneous fascia was then approximated using 3, 3-0 Vicryl sutures.  The skin edges were then approximated using 5, 3-0 Ethilon simple interrupted sutures.  Patient tolerated procedure well with minimal blood loss. Recommended he return in 7-10 days for suture removal. Wound care was discussed. Follow up immediately if competitions arise.  12/19/13 Patient complains of itching around the surgical site.  The patient is "spitting" the vicryl suture from the superior portion of the healed incision.  There is some mild local erythema. Past Medical History  Diagnosis Date  . Anxiety   . Hypertension   . Smoking   . DDD (degenerative disc disease)     saw Dr. Ace Gins   No current outpatient prescriptions on file prior to visit.   No current facility-administered medications on file prior to visit.   Allergies  Allergen Reactions  . Penicillins Nausea And Vomiting and Rash   History   Social History  . Marital Status: Married    Spouse Name: N/A    Number of Children: N/A  . Years of Education: N/A   Occupational History  . Not on file.   Social History Main Topics  . Smoking status: Current Every Day Smoker -- 1.50 packs/day    Types: Cigarettes  . Smokeless tobacco: Never Used  . Alcohol Use: No  . Drug Use: No  . Sexual Activity: Yes    Birth Control/ Protection: None   Other Topics Concern  . Not on file   Social History Narrative  . No narrative on file     Review of Systems  All other systems  reviewed and are negative.      Objective:   Physical Exam  Vitals reviewed. Cardiovascular: Normal rate and regular rhythm.   Pulmonary/Chest: Effort normal and breath sounds normal.    See description in HPI.     Assessment & Plan:  Suture reaction, initial encounter  Vicryl should dissolve in 60-90 days.  Anticipate that these will resolve by October if not sooner.  There is no sign of infection, residual cyst or abscess.  Patient is reassured.

## 2014-04-10 ENCOUNTER — Ambulatory Visit (INDEPENDENT_AMBULATORY_CARE_PROVIDER_SITE_OTHER): Payer: BC Managed Care – PPO | Admitting: Family Medicine

## 2014-04-10 ENCOUNTER — Encounter: Payer: Self-pay | Admitting: Family Medicine

## 2014-04-10 VITALS — BP 156/100 | HR 84 | Temp 97.4°F | Resp 18 | Wt 183.0 lb

## 2014-04-10 DIAGNOSIS — J029 Acute pharyngitis, unspecified: Secondary | ICD-10-CM

## 2014-04-10 DIAGNOSIS — R5383 Other fatigue: Secondary | ICD-10-CM

## 2014-04-10 DIAGNOSIS — R52 Pain, unspecified: Secondary | ICD-10-CM

## 2014-04-10 LAB — CBC W/MCH & 3 PART DIFF
HEMATOCRIT: 45.3 % (ref 39.0–52.0)
Hemoglobin: 16.2 g/dL (ref 13.0–17.0)
Lymphocytes Relative: 28 % (ref 12–46)
Lymphs Abs: 2.1 10*3/uL (ref 0.7–4.0)
MCH: 31.3 pg (ref 26.0–34.0)
MCHC: 35.8 g/dL (ref 30.0–36.0)
MCV: 87.5 fL (ref 78.0–100.0)
Neutro Abs: 4.6 10*3/uL (ref 1.7–7.7)
Neutrophils Relative %: 60 % (ref 43–77)
Platelets: 214 10*3/uL (ref 150–400)
RBC: 5.18 MIL/uL (ref 4.22–5.81)
RDW: 13 % (ref 11.5–15.5)
WBC mixed population %: 12 % (ref 3–18)
WBC: 7.6 10*3/uL (ref 4.0–10.5)
WBCMIX: 0.9 10*3/uL (ref 0.1–1.8)

## 2014-04-10 LAB — COMPLETE METABOLIC PANEL WITH GFR
ALBUMIN: 4.2 g/dL (ref 3.5–5.2)
ALK PHOS: 75 U/L (ref 39–117)
ALT: 13 U/L (ref 0–53)
AST: 16 U/L (ref 0–37)
BUN: 10 mg/dL (ref 6–23)
CO2: 26 mEq/L (ref 19–32)
Calcium: 8.8 mg/dL (ref 8.4–10.5)
Chloride: 105 mEq/L (ref 96–112)
Creat: 0.94 mg/dL (ref 0.50–1.35)
GFR, Est African American: >89 mL/min
GLUCOSE: 84 mg/dL (ref 70–99)
POTASSIUM: 3.9 meq/L (ref 3.5–5.3)
SODIUM: 140 meq/L (ref 135–145)
TOTAL PROTEIN: 6.3 g/dL (ref 6.0–8.3)
Total Bilirubin: 0.7 mg/dL (ref 0.2–1.2)

## 2014-04-10 LAB — RAPID STREP SCREEN (MED CTR MEBANE ONLY): Streptococcus, Group A Screen (Direct): NEGATIVE

## 2014-04-10 LAB — INFLUENZA A AND B
Inflenza A Ag: NEGATIVE
Influenza B Ag: NEGATIVE

## 2014-04-10 LAB — GLUCOSE, FINGERSTICK (STAT): GLUCOSE, FINGERSTICK: 86 mg/dL (ref 70–99)

## 2014-04-10 NOTE — Progress Notes (Signed)
Subjective:    Patient ID: Curtis Snow, male    DOB: 10/19/1975, 38 y.o.   MRN: [id number]  HPI 2 days ago, the patient developed profound fatigue. Patient slept over 14 hours. He continues to complain of profound fatigue. He is also complaining of myalgias in his lower back. He reports congestion in his nose although this is somewhat chronic. He also reports a mild sore throat. He has been working 16 hours a day, 6 days a week for the last several months..  He reports subjective fevers. He denies any ear pain. He denies any cough. He denies any shortness of breath. He denies any chest pain. He does report some nausea. He does report some anxiety. Past Medical History  Diagnosis Date  . Anxiety   . Hypertension   . Smoking   . DDD (degenerative disc disease)     saw Dr. Ace Gins   No past surgical history on file. No current outpatient prescriptions on file prior to visit.   No current facility-administered medications on file prior to visit.   Allergies  Allergen Reactions  . Penicillins Nausea And Vomiting and Rash   History   Social History  . Marital Status: Married    Spouse Name: N/A    Number of Children: N/A  . Years of Education: N/A   Occupational History  . Not on file.   Social History Main Topics  . Smoking status: Current Every Day Smoker -- 1.50 packs/day    Types: Cigarettes  . Smokeless tobacco: Never Used  . Alcohol Use: No  . Drug Use: No  . Sexual Activity: Yes    Birth Control/ Protection: None   Other Topics Concern  . Not on file   Social History Narrative      Review of Systems  All other systems reviewed and are negative.      Objective:   Physical Exam  Constitutional: He appears well-developed and well-nourished. No distress.  HENT:  Head: Normocephalic and atraumatic.  Right Ear: External ear normal.  Left Ear: External ear normal.  Nose: Nose normal.  Mouth/Throat: Oropharynx is clear and moist. No oropharyngeal  exudate.  Eyes: Conjunctivae are normal. Pupils are equal, round, and reactive to light.  Neck: Neck supple. No thyromegaly present.  Cardiovascular: Normal rate, regular rhythm and normal heart sounds.   No murmur heard. Pulmonary/Chest: Effort normal and breath sounds normal. No respiratory distress. He has no wheezes. He has no rales.  Abdominal: Soft. Bowel sounds are normal. He exhibits no distension and no mass. There is no tenderness. There is no rebound and no guarding.  Musculoskeletal: Normal range of motion.  Lymphadenopathy:    He has no cervical adenopathy.  Skin: Skin is warm. No rash noted. He is not diaphoretic. No erythema. No pallor.  Vitals reviewed.         Assessment & Plan:  Body aches - Plan: Rapid strep screen, Influenza a and b  Sorethroat - Plan: Rapid strep screen, Influenza a and b  Other fatigue - Plan: COMPLETE METABOLIC PANEL WITH GFR, CBC w/[facility] & 3 Part Diff, Glucose, fingerstick (stat)  Patient's flu test is negative. Strep test is negative. I obtained a CBC as well as a fingerstick glucose to rule out other causes of fatigue and a 38 year old. Given normal lab work I suspect this is secondary to a viral syndrome. I believe the patient needs to take the weekend off and rest. I anticipate by Monday that his symptoms should  be improving. I will gladly give the patient a note to stay out of work until Monday.

## 2014-05-25 ENCOUNTER — Encounter: Payer: Self-pay | Admitting: Family Medicine

## 2014-07-28 ENCOUNTER — Encounter: Payer: Self-pay | Admitting: Family Medicine

## 2015-08-31 ENCOUNTER — Other Ambulatory Visit (HOSPITAL_COMMUNITY): Payer: Self-pay | Admitting: Physician Assistant

## 2015-08-31 ENCOUNTER — Ambulatory Visit (HOSPITAL_COMMUNITY)
Admission: RE | Admit: 2015-08-31 | Discharge: 2015-08-31 | Disposition: A | Discharge status: Home / Self Care | Payer: Self-pay | Source: Ambulatory Visit | Attending: Physician Assistant | Admitting: Physician Assistant

## 2015-08-31 DIAGNOSIS — R42 Dizziness and giddiness: Secondary | ICD-10-CM | POA: Insufficient documentation

## 2015-08-31 DIAGNOSIS — R110 Nausea: Secondary | ICD-10-CM | POA: Insufficient documentation

## 2015-08-31 DIAGNOSIS — R61 Generalized hyperhidrosis: Secondary | ICD-10-CM | POA: Insufficient documentation

## 2015-08-31 DIAGNOSIS — R059 Cough, unspecified: Secondary | ICD-10-CM

## 2015-08-31 DIAGNOSIS — R05 Cough: Secondary | ICD-10-CM

## 2019-04-10 DIAGNOSIS — M549 Dorsalgia, unspecified: Secondary | ICD-10-CM | POA: Insufficient documentation

## 2019-04-10 DIAGNOSIS — Z800 Family history of malignant neoplasm of digestive organs: Secondary | ICD-10-CM | POA: Diagnosis not present

## 2019-04-10 DIAGNOSIS — K625 Hemorrhage of anus and rectum: Secondary | ICD-10-CM | POA: Diagnosis not present

## 2019-04-10 DIAGNOSIS — L02512 Cutaneous abscess of left hand: Secondary | ICD-10-CM | POA: Diagnosis not present

## 2019-04-22 DIAGNOSIS — Z880 Allergy status to penicillin: Secondary | ICD-10-CM | POA: Diagnosis not present

## 2019-04-22 DIAGNOSIS — H16133 Photokeratitis, bilateral: Secondary | ICD-10-CM | POA: Diagnosis not present

## 2019-04-28 ENCOUNTER — Encounter: Payer: Self-pay | Admitting: Internal Medicine

## 2019-05-08 ENCOUNTER — Encounter: Payer: Self-pay | Admitting: Internal Medicine

## 2019-05-13 ENCOUNTER — Ambulatory Visit: Payer: Medicaid Other | Admitting: Nurse Practitioner

## 2019-05-13 ENCOUNTER — Encounter: Payer: Self-pay | Admitting: *Deleted

## 2019-05-13 ENCOUNTER — Encounter: Payer: Self-pay | Admitting: Nurse Practitioner

## 2019-05-13 ENCOUNTER — Other Ambulatory Visit: Payer: Self-pay

## 2019-05-13 DIAGNOSIS — K625 Hemorrhage of anus and rectum: Secondary | ICD-10-CM

## 2019-05-13 MED ORDER — CLENPIQ 10-3.5-12 MG-GM -GM/160ML PO SOLN: 1.0000 | Freq: Once | ORAL | 0 refills | Status: AC

## 2019-05-13 NOTE — Progress Notes (Signed)
Primary Care Physician:  Patient, No Pcp Per Primary Gastroenterologist:  Dr. Gala Romney  Chief Complaint  Patient presents with  . Rectal Bleeding    blood on tissue    HPI:   Curtis Snow is a 44 y.o. male who presents on referral from urgent care.  Reviewed information from referral including office visit dated 04/10/2019 at [facility] urgent care for evaluation of hemorrhoids with intermittent mild rectal bleeding.  Recommended Colace twice daily, Anusol rectal cream, referral to GI.  No history of colonoscopy or endoscopy in our system.  Today he states he's doing well overall. He has noted rectal bleeding, mostly in the form of scant toilet tissue hematochezia, with occasional episodes of heavier bleeding. Typically has bleeding about once a week and heavier bleeding with large meals (like Thanksgiving or Christmas). Denies overt constipation but stools are irregular. Has a bowel movement about twice a day, typically consistent with Bristol 4; rare hard stools but occasionally has to straining which is when he'll typically had toile tissue hematochezia. Does typically have sensation of complete emptying. Denies persistent abdominal pain but since his 20's has an area to the right of his umbilical area that was hurt with increased stress. Denies N/V, melena, fever, chills, unintentional weight loss. Denies URI or flu-like symptoms. Denies loss of sense of taste or smell. Denies chest pain, dyspnea, dizziness, lightheadedness, syncope, near syncope. Denies any other upper or lower GI symptoms.   Not currently on any medications.  Past Medical History:  Diagnosis Date  . Anxiety   . DDD (degenerative disc disease)    saw Dr. Ace Gins  . Hypertension   . Smoking     Past Surgical History:  Procedure Laterality Date  . NO PAST SURGERIES      No current outpatient medications on file.   No current facility-administered medications for this visit.    Allergies as of 05/13/2019 -  Review Complete 05/13/2019  Allergen Reaction Noted  . Penicillins Nausea And Vomiting and Rash 06/27/2011    Family History  Problem Relation Age of Onset  . Arthritis Father   . Hypertension Father   . Hyperlipidemia Father   . Asthma Son   . COPD Maternal Grandmother   . Colon cancer Maternal Grandfather   . Heart disease Paternal Grandfather   . Colon cancer Maternal Aunt   . Breast cancer Maternal Aunt     Social History   Socioeconomic History  . Marital status: Married    Spouse name: Not on file  . Number of children: Not on file  . Years of education: Not on file  . Highest education level: Not on file  Occupational History  . Not on file  Tobacco Use  . Smoking status: Former Smoker    Packs/day: 1.50  . Smokeless tobacco: Never Used  . Tobacco comment: occ vapes  Substance and Sexual Activity  . Alcohol use: No  . Drug use: No  . Sexual activity: Yes    Birth control/protection: None  Other Topics Concern  . Not on file  Social History Narrative  . Not on file   Social Determinants of Health   Financial Resource Strain:   . Difficulty of Paying Living Expenses: Not on file  Food Insecurity:   . Worried About Charity fundraiser in the Last Year: Not on file  . Ran Out of Food in the Last Year: Not on file  Transportation Needs:   . Lack of Transportation (Medical): Not  on file  . Lack of Transportation (Non-Medical): Not on file  Physical Activity:   . Days of Exercise per Week: Not on file  . Minutes of Exercise per Session: Not on file  Stress:   . Feeling of Stress : Not on file  Social Connections:   . Frequency of Communication with Friends and Family: Not on file  . Frequency of Social Gatherings with Friends and Family: Not on file  . Attends Religious Services: Not on file  . Active Member of Clubs or Organizations: Not on file  . Attends Archivist Meetings: Not on file  . Marital Status: Not on file  Intimate Partner  Violence:   . Fear of Current or Ex-Partner: Not on file  . Emotionally Abused: Not on file  . Physically Abused: Not on file  . Sexually Abused: Not on file    Review of Systems: General: Negative for anorexia, weight loss, fever, chills, fatigue, weakness. ENT: Negative for hoarseness, difficulty swallowing. CV: Negative for chest pain, angina, palpitations, peripheral edema.  Respiratory: Negative for dyspnea at rest, cough, sputum, wheezing.  GI: See history of present illness. MS: Negative for joint pain, low back pain.  Derm: Negative for rash or itching.  Endo: Negative for unusual weight change.  Heme: Negative for bruising or bleeding. Allergy: Negative for rash or hives.    Physical Exam: BP (!) 140/93   Pulse 73   Temp (!) 97.3 F (36.3 C) (Temporal)   Ht 6\' 2"  (1.88 m)   Wt 179 lb 9.6 oz (81.5 kg)   BMI 23.06 kg/m  General:   Alert and oriented. Pleasant and cooperative. Well-nourished and well-developed.  Head:  Normocephalic and atraumatic. Eyes:  Without icterus, sclera clear and conjunctiva pink.  Ears:  Normal auditory acuity. Cardiovascular:  S1, S2 present without murmurs appreciated. Extremities without clubbing or edema. Respiratory:  Clear to auscultation bilaterally. No wheezes, rales, or rhonchi. No distress.  Gastrointestinal:  +BS, soft, non-tender and non-distended. No HSM noted. No guarding or rebound. No masses appreciated.  Rectal:  Deferred  Musculoskalatal:  Symmetrical without gross deformities. Neurologic:  Alert and oriented x4;  grossly normal neurologically. Psych:  Alert and cooperative. Normal mood and affect. Heme/Lymph/Immune: No excessive bruising noted.    05/13/2019 8:36 AM   Disclaimer: This note was dictated with voice recognition software. Similar sounding words can inadvertently be transcribed and may not be corrected upon review.

## 2019-05-13 NOTE — H&P (View-Only) (Signed)
Primary Care Physician:  Patient, No Pcp Per Primary Gastroenterologist:  Dr. Gala Romney  Chief Complaint  Patient presents with  . Rectal Bleeding    blood on tissue    HPI:   Curtis Snow is a 44 y.o. male who presents on referral from urgent care.  Reviewed information from referral including office visit dated 04/10/2019 at [facility] urgent care for evaluation of hemorrhoids with intermittent mild rectal bleeding.  Recommended Colace twice daily, Anusol rectal cream, referral to GI.  No history of colonoscopy or endoscopy in our system.  Today he states he's doing well overall. He has noted rectal bleeding, mostly in the form of scant toilet tissue hematochezia, with occasional episodes of heavier bleeding. Typically has bleeding about once a week and heavier bleeding with large meals (like Thanksgiving or Christmas). Denies overt constipation but stools are irregular. Has a bowel movement about twice a day, typically consistent with Bristol 4; rare hard stools but occasionally has to straining which is when he'll typically had toile tissue hematochezia. Does typically have sensation of complete emptying. Denies persistent abdominal pain but since his 20's has an area to the right of his umbilical area that was hurt with increased stress. Denies N/V, melena, fever, chills, unintentional weight loss. Denies URI or flu-like symptoms. Denies loss of sense of taste or smell. Denies chest pain, dyspnea, dizziness, lightheadedness, syncope, near syncope. Denies any other upper or lower GI symptoms.   Not currently on any medications.  Past Medical History:  Diagnosis Date  . Anxiety   . DDD (degenerative disc disease)    saw Dr. Ace Gins  . Hypertension   . Smoking     Past Surgical History:  Procedure Laterality Date  . NO PAST SURGERIES      No current outpatient medications on file.   No current facility-administered medications for this visit.    Allergies as of 05/13/2019 -  Review Complete 05/13/2019  Allergen Reaction Noted  . Penicillins Nausea And Vomiting and Rash 06/27/2011    Family History  Problem Relation Age of Onset  . Arthritis Father   . Hypertension Father   . Hyperlipidemia Father   . Asthma Son   . COPD Maternal Grandmother   . Colon cancer Maternal Grandfather   . Heart disease Paternal Grandfather   . Colon cancer Maternal Aunt   . Breast cancer Maternal Aunt     Social History   Socioeconomic History  . Marital status: Married    Spouse name: Not on file  . Number of children: Not on file  . Years of education: Not on file  . Highest education level: Not on file  Occupational History  . Not on file  Tobacco Use  . Smoking status: Former Smoker    Packs/day: 1.50  . Smokeless tobacco: Never Used  . Tobacco comment: occ vapes  Substance and Sexual Activity  . Alcohol use: No  . Drug use: No  . Sexual activity: Yes    Birth control/protection: None  Other Topics Concern  . Not on file  Social History Narrative  . Not on file   Social Determinants of Health   Financial Resource Strain:   . Difficulty of Paying Living Expenses: Not on file  Food Insecurity:   . Worried About Charity fundraiser in the Last Year: Not on file  . Ran Out of Food in the Last Year: Not on file  Transportation Needs:   . Lack of Transportation (Medical): Not  on file  . Lack of Transportation (Non-Medical): Not on file  Physical Activity:   . Days of Exercise per Week: Not on file  . Minutes of Exercise per Session: Not on file  Stress:   . Feeling of Stress : Not on file  Social Connections:   . Frequency of Communication with Friends and Family: Not on file  . Frequency of Social Gatherings with Friends and Family: Not on file  . Attends Religious Services: Not on file  . Active Member of Clubs or Organizations: Not on file  . Attends Archivist Meetings: Not on file  . Marital Status: Not on file  Intimate Partner  Violence:   . Fear of Current or Ex-Partner: Not on file  . Emotionally Abused: Not on file  . Physically Abused: Not on file  . Sexually Abused: Not on file    Review of Systems: General: Negative for anorexia, weight loss, fever, chills, fatigue, weakness. ENT: Negative for hoarseness, difficulty swallowing. CV: Negative for chest pain, angina, palpitations, peripheral edema.  Respiratory: Negative for dyspnea at rest, cough, sputum, wheezing.  GI: See history of present illness. MS: Negative for joint pain, low back pain.  Derm: Negative for rash or itching.  Endo: Negative for unusual weight change.  Heme: Negative for bruising or bleeding. Allergy: Negative for rash or hives.    Physical Exam: BP (!) 140/93   Pulse 73   Temp (!) 97.3 F (36.3 C) (Temporal)   Ht 6\' 2"  (1.88 m)   Wt 179 lb 9.6 oz (81.5 kg)   BMI 23.06 kg/m  General:   Alert and oriented. Pleasant and cooperative. Well-nourished and well-developed.  Head:  Normocephalic and atraumatic. Eyes:  Without icterus, sclera clear and conjunctiva pink.  Ears:  Normal auditory acuity. Cardiovascular:  S1, S2 present without murmurs appreciated. Extremities without clubbing or edema. Respiratory:  Clear to auscultation bilaterally. No wheezes, rales, or rhonchi. No distress.  Gastrointestinal:  +BS, soft, non-tender and non-distended. No HSM noted. No guarding or rebound. No masses appreciated.  Rectal:  Deferred  Musculoskalatal:  Symmetrical without gross deformities. Neurologic:  Alert and oriented x4;  grossly normal neurologically. Psych:  Alert and cooperative. Normal mood and affect. Heme/Lymph/Immune: No excessive bruising noted.    05/13/2019 8:36 AM   Disclaimer: This note was dictated with voice recognition software. Similar sounding words can inadvertently be transcribed and may not be corrected upon review.

## 2019-05-13 NOTE — Patient Instructions (Signed)
Your health issues we discussed today were:   Rectal bleeding: 1. Have your labs checked when you are able to 2. We will schedule a colonoscopy for you 3. Further recommendations will follow your colonoscopy 4. Call us if you have any worsening or severe bleeding or symptoms of significant blood loss such as chest pain, shortness of breath, dizziness, lightheadedness, passing out, nearly passing out, etc.  Overall I recommend:  1. Return for follow-up in 6 months 2. Call us if you have any questions or concerns.   Because of recent events of COVID-19 ("Coronavirus"), follow CDC recommendations:  Wash your hand frequently Avoid touching your face Stay away from people who are sick If you have symptoms such as fever, cough, shortness of breath then call your healthcare provider for further guidance If you are sick, STAY AT HOME unless otherwise directed by your healthcare provider. Follow directions from state and national officials regarding staying safe   At [facility] we value your feedback. You may receive a survey about your visit today. Please share your experience as we strive to create trusting relationships with our patients to provide genuine, compassionate, quality care.  We appreciate your understanding and patience as we review any laboratory studies, imaging, and other diagnostic tests that are ordered as we care for you. Our office policy is 5 business days for review of these results, and any emergent or urgent results are addressed in a timely manner for your best interest. If you do not hear from our office in 1 week, please contact us.   We also encourage the use of MyChart, which contains your medical information for your review as well. If you are not enrolled in this feature, an access code is on this after visit summary for your convenience. Thank you for allowing [place] to be involved in your care.  It was great to see you today!  I hope you have a  Happy New Year!!

## 2019-05-13 NOTE — Assessment & Plan Note (Signed)
The patient describes the toilet tissue hematochezia about once a week.  Has heavier bleeding if he has a large meal such as after a holiday.  Denies overt constipation but does have occasional straining which is when he typically notices the toilet tissue hematochezia.  No labs checked by urgent care.  At this point I will check a CBC given ongoing bleeding to ensure it is not affecting his labs.  Most likely benign anorectal source or cannot rule out bleeding polyps or less likely colorectal cancer, inflammatory bowel disease.  No other significant GI symptoms.  At this point we will proceed with a colonoscopy to further evaluate.  Proceed with TCS on conscious sedation or propofol with Dr. Gala Romney in near future: the risks, benefits, and alternatives have been discussed with the patient in detail. The patient states understanding and desires to proceed.  The patient is not on any medications.  Denies alcohol and drug use.  Conscious sedation or propofol would be appropriate for his sedation.

## 2019-05-13 NOTE — Addendum Note (Signed)
Addended by: Gordy Levan, Matthew Pais A on: 05/13/2019 08:51 AM   Modules accepted: Orders

## 2019-05-19 ENCOUNTER — Other Ambulatory Visit: Payer: Self-pay

## 2019-05-19 ENCOUNTER — Other Ambulatory Visit (HOSPITAL_COMMUNITY)
Admission: RE | Admit: 2019-05-19 | Discharge: 2019-05-19 | Disposition: A | Discharge status: Home / Self Care | Payer: Medicaid Other | Source: Ambulatory Visit | Attending: Internal Medicine | Admitting: Internal Medicine

## 2019-05-19 ENCOUNTER — Telehealth: Payer: Self-pay | Admitting: Internal Medicine

## 2019-05-19 DIAGNOSIS — Z20822 Contact with and (suspected) exposure to covid-19: Secondary | ICD-10-CM | POA: Insufficient documentation

## 2019-05-19 DIAGNOSIS — Z01812 Encounter for preprocedural laboratory examination: Secondary | ICD-10-CM | POA: Diagnosis not present

## 2019-05-19 LAB — SARS CORONAVIRUS 2 (TAT 6-24 HRS): SARS Coronavirus 2: NEGATIVE

## 2019-05-19 MED ORDER — CLENPIQ 10-3.5-12 MG-GM -GM/160ML PO SOLN: 1.0000 | Freq: Once | ORAL | 0 refills | Status: AC

## 2019-05-19 NOTE — Telephone Encounter (Signed)
Error/ [employer]

## 2019-05-19 NOTE — Telephone Encounter (Signed)
Called pt, pharmacy had other insurances on file at pharmacy. Unable to run rx with Medicaid as it was saying needs to be run through other insurance first. Medicaid is only insurance at this time. Pt last ate solid food last night. He is willing to get rx from another pharmacy. Requested to check [facility].  I called [facility], Clenpiq is in stock.  Clenpiq rx sent to [facility]. Pt aware.

## 2019-05-19 NOTE — Telephone Encounter (Signed)
Pt said he went to [facility] to pick up his prep. He is scheduled for tomorrow with RMR. He said the pharmacy wouldn't give him the prep because there was an issue with his insurance. He said he has medicaid. Please advise. [phone]

## 2019-05-20 ENCOUNTER — Encounter (HOSPITAL_COMMUNITY): Payer: Self-pay | Admitting: Internal Medicine

## 2019-05-20 ENCOUNTER — Encounter (HOSPITAL_COMMUNITY)
Admission: RE | Disposition: A | Discharge status: Home / Self Care | Payer: Self-pay | Source: Home / Self Care | Attending: Internal Medicine

## 2019-05-20 ENCOUNTER — Ambulatory Visit (HOSPITAL_COMMUNITY)
Admission: RE | Admit: 2019-05-20 | Discharge: 2019-05-20 | Disposition: A | Discharge status: Home / Self Care | Payer: Medicaid Other | Attending: Internal Medicine | Admitting: Internal Medicine

## 2019-05-20 ENCOUNTER — Other Ambulatory Visit: Payer: Self-pay

## 2019-05-20 ENCOUNTER — Telehealth: Payer: Self-pay

## 2019-05-20 DIAGNOSIS — Z87891 Personal history of nicotine dependence: Secondary | ICD-10-CM | POA: Diagnosis not present

## 2019-05-20 DIAGNOSIS — D124 Benign neoplasm of descending colon: Secondary | ICD-10-CM | POA: Insufficient documentation

## 2019-05-20 DIAGNOSIS — K640 First degree hemorrhoids: Secondary | ICD-10-CM | POA: Insufficient documentation

## 2019-05-20 DIAGNOSIS — K921 Melena: Secondary | ICD-10-CM | POA: Insufficient documentation

## 2019-05-20 DIAGNOSIS — I10 Essential (primary) hypertension: Secondary | ICD-10-CM | POA: Insufficient documentation

## 2019-05-20 DIAGNOSIS — K635 Polyp of colon: Secondary | ICD-10-CM

## 2019-05-20 DIAGNOSIS — K625 Hemorrhage of anus and rectum: Secondary | ICD-10-CM

## 2019-05-20 DIAGNOSIS — D123 Benign neoplasm of transverse colon: Secondary | ICD-10-CM | POA: Insufficient documentation

## 2019-05-20 HISTORY — PX: POLYPECTOMY: SHX5525

## 2019-05-20 HISTORY — PX: COLONOSCOPY: SHX5424

## 2019-05-20 SURGERY — COLONOSCOPY
Anesthesia: Moderate Sedation

## 2019-05-20 MED ORDER — STERILE WATER FOR IRRIGATION IR SOLN
Status: DC | PRN
  Administered 2019-05-20: 1.5 mL

## 2019-05-20 MED ORDER — SODIUM CHLORIDE 0.9 % IV SOLN: INTRAVENOUS | Status: DC

## 2019-05-20 MED ORDER — MEPERIDINE HCL 100 MG/ML IJ SOLN
INTRAMUSCULAR | Status: DC | PRN
  Administered 2019-05-20 (×2): 25 mg via INTRAVENOUS

## 2019-05-20 MED ORDER — MIDAZOLAM HCL 5 MG/5ML IJ SOLN
INTRAMUSCULAR | Status: AC
  Filled 2019-05-20: qty 10

## 2019-05-20 MED ORDER — MEPERIDINE HCL 50 MG/ML IJ SOLN
INTRAMUSCULAR | Status: AC
  Filled 2019-05-20: qty 1

## 2019-05-20 MED ORDER — MIDAZOLAM HCL 5 MG/5ML IJ SOLN
INTRAMUSCULAR | Status: DC | PRN
  Administered 2019-05-20: 2 mg via INTRAVENOUS
  Administered 2019-05-20: 1 mg via INTRAVENOUS
  Administered 2019-05-20 (×2): 2 mg via INTRAVENOUS

## 2019-05-20 MED ORDER — ONDANSETRON HCL 4 MG/2ML IJ SOLN
INTRAMUSCULAR | Status: DC | PRN
  Administered 2019-05-20: 4 mg via INTRAVENOUS

## 2019-05-20 MED ORDER — ONDANSETRON HCL 4 MG/2ML IJ SOLN
INTRAMUSCULAR | Status: AC
  Filled 2019-05-20: qty 2

## 2019-05-20 NOTE — Progress Notes (Signed)
Please excuse Mr Siverling from work 05/20/2019-05/21/2019 due to sedation for his procedure.  If any questions [phone]  Thanks,  Rosalyn Gess RN

## 2019-05-20 NOTE — Op Note (Signed)
[facility] Patient Name: Curtis Snow Procedure Date: 05/20/2019 1:53 PM MRN: [id number] Date of Birth: 1976-03-20 Attending MD: Norvel Richards , MD CSN: [id number] Age: 44 Admit Type: Outpatient Procedure:                Colonoscopy Indications:              Hematochezia Providers:                Norvel Richards, MD, Charlsie Quest. Theda Sers RN, RN,                            Randa Spike, Technician Referring MD:              Medicines:                Midazolam 8 mg IV, Meperidine 50 mg IV, Ondansetron                            4 mg IV Complications:            No immediate complications. Estimated Blood Loss:     Estimated blood loss was minimal. Procedure:                Pre-Anesthesia Assessment:                           - Prior to the procedure, a History and Physical                            was performed, and patient medications and                            allergies were reviewed. The patient's tolerance of                            previous anesthesia was also reviewed. The risks                            and benefits of the procedure and the sedation                            options and risks were discussed with the patient.                            All questions were answered, and informed consent                            was obtained. Prior Anticoagulants: The patient has                            taken no previous anticoagulant or antiplatelet                            agents. ASA Grade Assessment: II - A patient with  mild systemic disease. After reviewing the risks                            and benefits, the patient was deemed in                            satisfactory condition to undergo the procedure.                           After obtaining informed consent, the colonoscope                            was passed under direct vision. Throughout the                            procedure, the patient's blood  pressure, pulse, and                            oxygen saturations were monitored continuously. The                            [id number] scope was introduced through                            the anus and advanced to the the cecum, identified                            by appendiceal orifice and ileocecal valve. The                            colonoscopy was performed without difficulty. The                            patient tolerated the procedure well. The quality                            of the bowel preparation was adequate. Scope In: 2:17:44 PM Scope Out: 2:35:25 PM Scope Withdrawal Time: 0 hours 13 minutes 15 seconds  Total Procedure Duration: 0 hours 17 minutes 41 seconds  Findings:      The perianal and digital rectal examinations were normal.      Four sessile polyps were found in the descending colon and hepatic       flexure. The polyps were 4 to 5 mm in size. These polyps were removed       with a cold snare. Resection and retrieval were complete. Estimated       blood loss was minimal.      Non-bleeding internal hemorrhoids were found during retroflexion. The       hemorrhoids were mild, small and Grade I (internal hemorrhoids that do       not prolapse).      The exam was otherwise without abnormality on direct and retroflexion       views. Impression:               - Four 4 to 5 mm polyps in the descending  colon and                            at the hepatic flexure, removed with a cold snare.                            Resected and retrieved.                           - Non-bleeding internal hemorrhoids.                           - The examination was otherwise normal on direct                            and retroflexion views. Moderate Sedation:      Moderate (conscious) sedation was administered by the endoscopy nurse       and supervised by the endoscopist. The following parameters were       monitored: oxygen saturation, heart rate, blood pressure,  respiratory       rate, EKG, adequacy of pulmonary ventilation, and response to care.       Total physician intraservice time was 26 minutes. Recommendation:           - Patient has a contact number available for                            emergencies. The signs and symptoms of potential                            delayed complications were discussed with the                            patient. Return to normal activities tomorrow.                            Written discharge instructions were provided to the                            patient.                           - Resume previous diet.                           - Continue present medications.                           - Repeat colonoscopy date to be determined after                            pending pathology results are reviewed for                            surveillance.                           -  Return to GI office (date not yet determined). Procedure Code(s):        --- Professional ---                           [phone], Colonoscopy, flexible; with removal of                            tumor(s), polyp(s), or other lesion(s) by snare                            technique                           99153, Moderate sedation; each additional 15                            minutes intraservice time                           G0500, Moderate sedation services provided by the                            same physician or other qualified health care                            professional performing a gastrointestinal                            endoscopic service that sedation supports,                            requiring the presence of an independent trained                            observer to assist in the monitoring of the                            patient's level of consciousness and physiological                            status; initial 15 minutes of intra-service time;                            patient age 7 years or  older (additional time may                            be reported with [phone], as appropriate) Diagnosis Code(s):        --- Professional ---                           K63.5, Polyp of colon                           K64.0, First degree hemorrhoids  K92.1, Melena (includes Hematochezia) CPT copyright 2019 American Medical Association. All rights reserved. The codes documented in this report are preliminary and upon coder review may  be revised to meet current compliance requirements. Cristopher Estimable. Bethenny Losee, MD Norvel Richards, MD 05/20/2019 2:45:46 PM This report has been signed electronically. Number of Addenda: 0

## 2019-05-20 NOTE — Telephone Encounter (Signed)
Pt called office, he drank 1st bottle of prep last night. Approx 1 hour after drinking prep he vomited and then passed out. Significant other said his eyes rolled back in head. He had 3-4 loose stools last night. Wants to know if he should proceed with 2nd bottle of prep this morning.  Spoke to EG. EG advised for pt to start 2nd bottle, don't strain when he feels urge to have a BM. Have significant other monitor him closely and call 911 if needed.  Called pt, gave him advice from EG. Pt admitted he "chugged" prep quickly as he didn't like the taste. Advised him to not drink prep quickly as it will cause vomiting and drink clear liquids per instructions. Verbalized understanding.

## 2019-05-20 NOTE — Interval H&P Note (Signed)
History and Physical Interval Note:  05/20/2019 2:03 PM  Curtis Snow  has presented today for surgery, with the diagnosis of RB.  The various methods of treatment have been discussed with the patient and family. After consideration of risks, benefits and other options for treatment, the patient has consented to  Procedure(s) with comments: COLONOSCOPY (N/A) - 1:30pm as a surgical intervention.  The patient's history has been reviewed, patient examined, no change in status, stable for surgery.  I have reviewed the patient's chart and labs.  Questions were answered to the patient's satisfaction.     Erik Burkett  No change.  Diagnostic colonoscopy per plan. The risks, benefits, limitations, alternatives and imponderables have been reviewed with the patient. Questions have been answered. All parties are agreeable.

## 2019-05-20 NOTE — Discharge Instructions (Signed)
Colonoscopy Discharge Instructions  Read the instructions outlined below and refer to this sheet in the next few weeks. These discharge instructions provide you with general information on caring for yourself after you leave the hospital. Your doctor may also give you specific instructions. While your treatment has been planned according to the most current medical practices available, unavoidable complications occasionally occur. If you have any problems or questions after discharge, call Dr. Gala Romney at [phone]. ACTIVITY  You may resume your regular activity, but move at a slower pace for the next 24 hours.   Take frequent rest periods for the next 24 hours.   Walking will help get rid of the air and reduce the bloated feeling in your belly (abdomen).   No driving for 24 hours (because of the medicine (anesthesia) used during the test).    Do not sign any important legal documents or operate any machinery for 24 hours (because of the anesthesia used during the test).  NUTRITION  Drink plenty of fluids.   You may resume your normal diet as instructed by your doctor.   Begin with a light meal and progress to your normal diet. Heavy or fried foods are harder to digest and may make you feel sick to your stomach (nauseated).   Avoid alcoholic beverages for 24 hours or as instructed.  MEDICATIONS  You may resume your normal medications unless your doctor tells you otherwise.  WHAT YOU CAN EXPECT TODAY  Some feelings of bloating in the abdomen.   Passage of more gas than usual.   Spotting of blood in your stool or on the toilet paper.  IF YOU HAD POLYPS REMOVED DURING THE COLONOSCOPY:  No aspirin products for 7 days or as instructed.   No alcohol for 7 days or as instructed.   Eat a soft diet for the next 24 hours.  FINDING OUT THE RESULTS OF YOUR TEST Not all test results are available during your visit. If your test results are not back during the visit, make an appointment  with your caregiver to find out the results. Do not assume everything is normal if you have not heard from your caregiver or the medical facility. It is important for you to follow up on all of your test results.  SEEK IMMEDIATE MEDICAL ATTENTION IF:  You have more than a spotting of blood in your stool.   Your belly is swollen (abdominal distention).   You are nauseated or vomiting.   You have a temperature over 101.   You have abdominal pain or discomfort that is severe or gets worse throughout the day.   Hemorrhoids Hemorrhoids are swollen veins in and around the rectum or anus. There are two types of hemorrhoids:  Internal hemorrhoids. These occur in the veins that are just inside the rectum. They may poke through to the outside and become irritated and painful.  External hemorrhoids. These occur in the veins that are outside the anus and can be felt as a painful swelling or hard lump near the anus. Most hemorrhoids do not cause serious problems, and they can be managed with home treatments such as diet and lifestyle changes. If home treatments do not help the symptoms, procedures can be done to shrink or remove the hemorrhoids. What are the causes? This condition is caused by increased pressure in the anal area. This pressure may result from various things, including:  Constipation.  Straining to have a bowel movement.  Diarrhea.  Pregnancy.  Obesity.  Sitting for  long periods of time.  Heavy lifting or other activity that causes you to strain.  Anal sex.  Riding a bike for a long period of time. What are the signs or symptoms? Symptoms of this condition include:  Pain.  Anal itching or irritation.  Rectal bleeding.  Leakage of stool (feces).  Anal swelling.  One or more lumps around the anus. How is this diagnosed? This condition can often be diagnosed through a visual exam. Other exams or tests may also be done, such as:  An exam that involves feeling  the rectal area with a gloved hand (digital rectal exam).  An exam of the anal canal that is done using a small tube (anoscope).  A blood test, if you have lost a significant amount of blood.  A test to look inside the colon using a flexible tube with a camera on the end (sigmoidoscopy or colonoscopy). How is this treated? This condition can usually be treated at home. However, various procedures may be done if dietary changes, lifestyle changes, and other home treatments do not help your symptoms. These procedures can help make the hemorrhoids smaller or remove them completely. Some of these procedures involve surgery, and others do not. Common procedures include:  Rubber band ligation. Rubber bands are placed at the base of the hemorrhoids to cut off their blood supply.  Sclerotherapy. Medicine is injected into the hemorrhoids to shrink them.  Infrared coagulation. A type of light energy is used to get rid of the hemorrhoids.  Hemorrhoidectomy surgery. The hemorrhoids are surgically removed, and the veins that supply them are tied off.  Stapled hemorrhoidopexy surgery. The surgeon staples the base of the hemorrhoid to the rectal wall. Follow these instructions at home: Eating and drinking   Eat foods that have a lot of fiber in them, such as whole grains, beans, nuts, fruits, and vegetables.  Ask your health care provider about taking products that have added fiber (fiber supplements).  Reduce the amount of fat in your diet. You can do this by eating low-fat dairy products, eating less red meat, and avoiding processed foods.  Drink enough fluid to keep your urine pale yellow. Managing pain and swelling   Take warm sitz baths for 20 minutes, 3-4 times a day to ease pain and discomfort. You may do this in a bathtub or using a portable sitz bath that fits over the toilet.  If directed, apply ice to the affected area. Using ice packs between sitz baths may be helpful. ? Put ice in a  plastic bag. ? Place a towel between your skin and the bag. ? Leave the ice on for 20 minutes, 2-3 times a day. General instructions  Take over-the-counter and prescription medicines only as told by your health care provider.  Use medicated creams or suppositories as told.  Get regular exercise. Ask your health care provider how much and what kind of exercise is best for you. In general, you should do moderate exercise for at least 30 minutes on most days of the week (150 minutes each week). This can include activities such as walking, biking, or yoga.  Go to the bathroom when you have the urge to have a bowel movement. Do not wait.  Avoid straining to have bowel movements.  Keep the anal area dry and clean. Use wet toilet paper or moist towelettes after a bowel movement.  Do not sit on the toilet for long periods of time. This increases blood pooling and pain.  Keep  all follow-up visits as told by your health care provider. This is important. Contact a health care provider if you have:  Increasing pain and swelling that are not controlled by treatment or medicine.  Difficulty having a bowel movement, or you are unable to have a bowel movement.  Pain or inflammation outside the area of the hemorrhoids. Get help right away if you have:  Uncontrolled bleeding from your rectum. Summary  Hemorrhoids are swollen veins in and around the rectum or anus.  Most hemorrhoids can be managed with home treatments such as diet and lifestyle changes.  Taking warm sitz baths can help ease pain and discomfort.  In severe cases, procedures or surgery can be done to shrink or remove the hemorrhoids. This information is not intended to replace advice given to you by your health care provider. Make sure you discuss any questions you have with your health care provider. Document Revised: 09/13/2018 Document Reviewed: 09/06/2017 Elsevier Patient Education  [place].  Colon  Polyps  Polyps are tissue growths inside the body. Polyps can grow in many places, including the large intestine (colon). A polyp may be a round bump or a mushroom-shaped growth. You could have one polyp or several. Most colon polyps are noncancerous (benign). However, some colon polyps can become cancerous over time. Finding and removing the polyps early can help prevent this. What are the causes? The exact cause of colon polyps is not known. What increases the risk? You are more likely to develop this condition if you:  Have a family history of colon cancer or colon polyps.  Are older than 2 or older than 45 if you are African American.  Have inflammatory bowel disease, such as ulcerative colitis or Crohn's disease.  Have certain hereditary conditions, such as: ? Familial adenomatous polyposis. ? Lynch syndrome. ? Turcot syndrome. ? Peutz-Jeghers syndrome.  Are overweight.  Smoke cigarettes.  Do not get enough exercise.  Drink too much alcohol.  Eat a diet that is high in fat and red meat and low in fiber.  Had childhood cancer that was treated with abdominal radiation. What are the signs or symptoms? Most polyps do not cause symptoms. If you have symptoms, they may include:  Blood coming from your rectum when having a bowel movement.  Blood in your stool. The stool may look dark red or black.  Abdominal pain.  A change in bowel habits, such as constipation or diarrhea. How is this diagnosed? This condition is diagnosed with a colonoscopy. This is a procedure in which a lighted, flexible scope is inserted into the anus and then passed into the colon to examine the area. Polyps are sometimes found when a colonoscopy is done as part of routine cancer screening tests. How is this treated? Treatment for this condition involves removing any polyps that are found. Most polyps can be removed during a colonoscopy. Those polyps will then be tested for cancer. Additional  treatment may be needed depending on the results of testing. Follow these instructions at home: Lifestyle  Maintain a healthy weight, or lose weight if recommended by your health care provider.  Exercise every day or as told by your health care provider.  Do not use any products that contain nicotine or tobacco, such as cigarettes and e-cigarettes. If you need help quitting, ask your health care provider.  If you drink alcohol, limit how much you have: ? 0-1 drink a day for women. ? 0-2 drinks a day for men.  Be aware  of how much alcohol is in your drink. In the [place] one drink equals one 12 oz bottle of beer (355 mL), one 5 oz glass of wine (148 mL), or one 1 oz shot of hard liquor (44 mL). Eating and drinking   Eat foods that are high in fiber, such as fruits, vegetables, and whole grains.  Eat foods that are high in calcium and vitamin D, such as milk, cheese, yogurt, eggs, liver, fish, and broccoli.  Limit foods that are high in fat, such as fried foods and desserts.  Limit the amount of red meat and processed meat you eat, such as hot dogs, sausage, bacon, and lunch meats. General instructions  Keep all follow-up visits as told by your health care provider. This is important. ? This includes having regularly scheduled colonoscopies. ? Talk to your health care provider about when you need a colonoscopy. Contact a health care provider if:  You have new or worsening bleeding during a bowel movement.  You have new or increased blood in your stool.  You have a change in bowel habits.  You lose weight for no known reason. Summary  Polyps are tissue growths inside the body. Polyps can grow in many places, including the colon.  Most colon polyps are noncancerous (benign), but some can become cancerous over time.  This condition is diagnosed with a colonoscopy.  Treatment for this condition involves removing any polyps that are found. Most polyps can be removed during a  colonoscopy. This information is not intended to replace advice given to you by your health care provider. Make sure you discuss any questions you have with your health care provider. Document Revised: 08/02/2017 Document Reviewed: 08/02/2017 Elsevier Patient Education  [place] and colon polyp information provided  Further recommendations to follow pending review of pathology report  At patient request, I spoke to [place], father.  [phone]

## 2019-05-22 ENCOUNTER — Encounter: Payer: Self-pay | Admitting: Internal Medicine

## 2019-05-22 ENCOUNTER — Telehealth: Payer: Self-pay

## 2019-05-22 LAB — SURGICAL PATHOLOGY

## 2019-05-22 NOTE — Telephone Encounter (Signed)
Curtis Snow at [facility] called office requesting records so she can close out referral. OV and TCS report faxed to [phone]. Curtis Snow is aware pathology report is pending.

## 2019-10-29 ENCOUNTER — Ambulatory Visit (INDEPENDENT_AMBULATORY_CARE_PROVIDER_SITE_OTHER): Payer: Medicaid Other | Admitting: Internal Medicine

## 2019-10-30 ENCOUNTER — Other Ambulatory Visit: Payer: Self-pay

## 2019-10-30 ENCOUNTER — Ambulatory Visit (INDEPENDENT_AMBULATORY_CARE_PROVIDER_SITE_OTHER): Payer: BC Managed Care – PPO | Admitting: Nurse Practitioner

## 2019-10-30 ENCOUNTER — Encounter (INDEPENDENT_AMBULATORY_CARE_PROVIDER_SITE_OTHER): Payer: Self-pay | Admitting: Nurse Practitioner

## 2019-10-30 VITALS — BP 150/100 | HR 93 | Temp 97.5°F | Ht 74.0 in | Wt 170.8 lb

## 2019-10-30 DIAGNOSIS — R079 Chest pain, unspecified: Secondary | ICD-10-CM

## 2019-10-30 DIAGNOSIS — Z1329 Encounter for screening for other suspected endocrine disorder: Secondary | ICD-10-CM

## 2019-10-30 DIAGNOSIS — Z87448 Personal history of other diseases of urinary system: Secondary | ICD-10-CM

## 2019-10-30 DIAGNOSIS — Z139 Encounter for screening, unspecified: Secondary | ICD-10-CM

## 2019-10-30 DIAGNOSIS — Z131 Encounter for screening for diabetes mellitus: Secondary | ICD-10-CM | POA: Diagnosis not present

## 2019-10-30 DIAGNOSIS — I10 Essential (primary) hypertension: Secondary | ICD-10-CM | POA: Diagnosis not present

## 2019-10-30 DIAGNOSIS — Z87442 Personal history of urinary calculi: Secondary | ICD-10-CM

## 2019-10-30 DIAGNOSIS — Z13220 Encounter for screening for lipoid disorders: Secondary | ICD-10-CM | POA: Diagnosis not present

## 2019-10-30 MED ORDER — HYDROCHLOROTHIAZIDE 12.5 MG PO TABS: 12.5000 mg | ORAL_TABLET | Freq: Every day | ORAL | 0 refills | Status: DC

## 2019-10-30 NOTE — Progress Notes (Addendum)
Subjective:  Patient ID: Curtis Snow, male    DOB: 08-Feb-1976  Age: 44 y.o. MRN: [id number]  CC:  Chief Complaint  Patient presents with  . Hypertension  . Establish Care      HPI  This patient comes in today to establish care at this office.    He is a 44 year old male with past medical history significant for but not limited to bilateral kidney cysts, kidney stones, anxiety, and hypertension.  He tells me that he has not been to a primary care provider in quite some time.  He is here to establish care because he was sent home from work the other day when he has blood pressure checked there and it was greater than 563 systolically.  He tells me generally he feels well, however during review of systems he does mention that he has had a couple of very mild episodes of chest pain over the last 2 years.  They have not bothered him enough for him to seek evaluation.  He also mentions he does feel cardiac palpitations especially when he lies down on his left side.  He reports that he underwent myocardial stress testing approximately 4 to 5 years ago and was told that his test came back normal.  Additionally, a couple of years ago he had imaging done of his kidneys which showed that he had bilateral kidney stones as well as bilateral kidney cysts.  He is requesting repeat imaging for further evaluation.   Past Medical History:  Diagnosis Date  . Anxiety   . DDD (degenerative disc disease)    saw Dr. Ace Gins  . Hypertension   . Smoking       Family History  Problem Relation Age of Onset  . Arthritis Father   . Hypertension Father   . Hyperlipidemia Father   . Asthma Son   . COPD Maternal Grandmother   . Colon cancer Maternal Grandfather   . Heart disease Paternal Grandfather   . Colon cancer Maternal Aunt   . Breast cancer Maternal Aunt     Social History   Social History Narrative  . Not on file   Social History   Tobacco Use  . Smoking status: Current Every  Day Smoker    Packs/day: 1.00    Types: Cigarettes  . Smokeless tobacco: Never Used  . Tobacco comment: occ vapes  Substance Use Topics  . Alcohol use: No     Current Meds  Medication Sig  . ibuprofen (ADVIL) 200 MG tablet Take 400 mg by mouth every 6 (six) hours as needed for headache or moderate pain.    ROS:  Review of Systems  Constitutional: Positive for diaphoresis (mild under his armpits intermittent).  Eyes: Positive for blurred vision.  Respiratory: Negative.   Cardiovascular: Positive for chest pain and palpitations.  Neurological: Positive for dizziness and headaches.     Objective:   Today's Vitals: BP (!) 150/100 (BP Location: Left Arm, Patient Position: Sitting, Cuff Size: Normal)   Pulse 93   Temp (!) 97.5 F (36.4 C) (Temporal)   Ht _0  (1.88 m)   Wt 170 lb 12.8 oz (77.5 kg)   SpO2 96%   BMI 21.93 kg/m  Vitals with BMI 10/30/2019 05/20/2019 05/20/2019  Height _1  - -  Weight 170 lbs 13 oz - -  BMI 14.97 - -  Systolic 026 378 588  Diastolic 502 73 75  Pulse 93 64 64     Physical Exam  Vitals reviewed.  Constitutional:      Appearance: Normal appearance.  HENT:     Head: Normocephalic and atraumatic.  Cardiovascular:     Rate and Rhythm: Normal rate and regular rhythm.  Pulmonary:     Effort: Pulmonary effort is normal.     Breath sounds: Normal breath sounds.  Musculoskeletal:     Cervical back: Neck supple.  Skin:    General: Skin is warm and dry.  Neurological:     Mental Status: He is alert and oriented to person, place, and time.  Psychiatric:        Mood and Affect: Mood normal.        Behavior: Behavior normal.        Thought Content: Thought content normal.        Judgment: Judgment normal.    EKG: Sinus arrhythmia; high voltage in left ventricle      Assessment and Plan   1. Essential hypertension   2. Screening for condition   3. Screening for lipid disorders   4. Screening for diabetes mellitus   5. Screening for  thyroid disorder   6. History of kidney stones   7. History of cystic kidney disease   8. Chest pain, unspecified type      Plan: 1-5.  I will initiate him on hydrochlorothiazide to help control his hypertension.  Unfortunately I do not have a metabolic panel available at this time to check his kidney function, thus I told him once I get the blood work results back I will give him a call to let me know if he can continue this medication or not.  I should have these results by tomorrow.  I will also collect other blood work to screen for hyperlipidemia, thyroid disease, and diabetes.   6.-7.  I will order ultrasound of his kidneys for further evaluation.  8.  EKG did show some possible signs of left ventricular hypertrophy.  Review of systems did reveal that he does have some intermittent sweating under his armpits which he tells me is new for him.  He tells me when he was younger he never had much sweating.  Thus, based on his medical history of hypertension, episodes of diaphoresis, cardiac palpitations, and mild chest pain I will refer him to cardiology for further evaluation as I am concerned that he may have some underlying coronary artery disease and/or left ventricular hypertrophy.  In the meantime we did discuss red flag symptoms of myocardial infarction including chest pain, diaphoresis, severe fatigue, and that if these were to occur he needs to call 911.  He tells me he understands.  Tests ordered Orders Placed This Encounter  Procedures  . US RENAL  . CBC with Differential/Platelets  . CMP with eGFR([facility])  . Lipid Panel  . Hemoglobin A1c  . TSH  . Ambulatory referral to Cardiology      Meds ordered this encounter  Medications  . hydrochlorothiazide (HYDRODIURIL) 12.5 MG tablet    Sig: Take 1 tablet (12.5 mg total) by mouth daily.    Dispense:  90 tablet    Refill:  0    Order Specific Question:   Supervising Provider    Answer:   Doree Albee [[id number]]    Patient  to follow-up in 2 weeks or sooner as needed.  Ailene Ards, NP

## 2019-10-31 LAB — CBC WITH DIFFERENTIAL/PLATELET
Absolute Monocytes: 479 cells/uL (ref 200–950)
Basophils Absolute: 19 cells/uL (ref 0–200)
Basophils Relative: 0.3 %
Eosinophils Absolute: 271 cells/uL (ref 15–500)
Eosinophils Relative: 4.3 %
HCT: 47.6 % (ref 38.5–50.0)
Hemoglobin: 16.3 g/dL (ref 13.2–17.1)
Lymphs Abs: 1518 cells/uL (ref 850–3900)
MCH: 29.1 pg (ref 27.0–33.0)
MCHC: 34.2 g/dL (ref 32.0–36.0)
MCV: 84.8 fL (ref 80.0–100.0)
MPV: 10.1 fL (ref 7.5–12.5)
Monocytes Relative: 7.6 %
Neutro Abs: 4013 cells/uL (ref 1500–7800)
Neutrophils Relative %: 63.7 %
Platelets: 234 10*3/uL (ref 140–400)
RBC: 5.61 10*6/uL (ref 4.20–5.80)
RDW: 12.1 % (ref 11.0–15.0)
Total Lymphocyte: 24.1 %
WBC: 6.3 10*3/uL (ref 3.8–10.8)

## 2019-10-31 LAB — COMPLETE METABOLIC PANEL WITH GFR
AG Ratio: 1.8 (calc) (ref 1.0–2.5)
ALT: 9 U/L (ref 9–46)
AST: 14 U/L (ref 10–40)
Albumin: 4.1 g/dL (ref 3.6–5.1)
Alkaline phosphatase (APISO): 76 U/L (ref 36–130)
BUN: 10 mg/dL (ref 7–25)
CO2: 31 mmol/L (ref 20–32)
Calcium: 9 mg/dL (ref 8.6–10.3)
Chloride: 103 mmol/L (ref 98–110)
Creat: 0.98 mg/dL (ref 0.60–1.35)
GFR, Est African American: 109 mL/min/{1.73_m2} (ref >=60)
GFR, Est Non African American: 94 mL/min/{1.73_m2} (ref >=60)
Globulin: 2.3 g/dL (calc) (ref 1.9–3.7)
Glucose, Bld: 83 mg/dL (ref 65–99)
Potassium: 3.9 mmol/L (ref 3.5–5.3)
Sodium: 139 mmol/L (ref 135–146)
Total Bilirubin: 1 mg/dL (ref 0.2–1.2)
Total Protein: 6.4 g/dL (ref 6.1–8.1)

## 2019-10-31 LAB — LIPID PANEL
Cholesterol: 131 mg/dL (ref <200)
HDL: 37 mg/dL — ABNORMAL LOW (ref >=40)
LDL Cholesterol (Calc): 76 mg/dL (calc)
Non-HDL Cholesterol (Calc): 94 mg/dL (calc) (ref <130)
Total CHOL/HDL Ratio: 3.5 (calc) (ref <5.0)
Triglycerides: 97 mg/dL (ref <150)

## 2019-10-31 LAB — HEMOGLOBIN A1C
Hgb A1c MFr Bld: 5.1 % of total Hgb (ref <5.7)
Mean Plasma Glucose: 100 (calc)
eAG (mmol/L): 5.5 (calc)

## 2019-10-31 LAB — TSH: TSH: 1.74 mIU/L (ref 0.40–4.50)

## 2019-11-10 ENCOUNTER — Ambulatory Visit (HOSPITAL_COMMUNITY): Admission: RE | Admit: 2019-11-10 | Payer: BC Managed Care – PPO | Source: Ambulatory Visit

## 2019-11-11 ENCOUNTER — Ambulatory Visit: Payer: Medicaid Other | Admitting: Nurse Practitioner

## 2019-11-14 ENCOUNTER — Ambulatory Visit (HOSPITAL_COMMUNITY): Payer: BC Managed Care – PPO

## 2019-11-17 ENCOUNTER — Other Ambulatory Visit: Payer: Self-pay

## 2019-11-17 ENCOUNTER — Encounter (INDEPENDENT_AMBULATORY_CARE_PROVIDER_SITE_OTHER): Payer: Self-pay | Admitting: Nurse Practitioner

## 2019-11-17 ENCOUNTER — Ambulatory Visit (INDEPENDENT_AMBULATORY_CARE_PROVIDER_SITE_OTHER): Payer: BC Managed Care – PPO | Admitting: Nurse Practitioner

## 2019-11-17 VITALS — BP 140/80 | HR 101 | Temp 98.1°F | Ht 74.0 in | Wt 167.0 lb

## 2019-11-17 DIAGNOSIS — I10 Essential (primary) hypertension: Secondary | ICD-10-CM

## 2019-11-17 MED ORDER — LOSARTAN POTASSIUM 25 MG PO TABS: 25.0000 mg | ORAL_TABLET | Freq: Every day | ORAL | 0 refills | Status: DC

## 2019-11-17 NOTE — Progress Notes (Signed)
Subjective:  Patient ID: Curtis Snow, male    DOB: Jan 13, 1976  Age: 44 y.o. MRN: [id number]  CC:  Chief Complaint  Patient presents with  . Hypertension  . Headache    left side mainly      HPI  This patient comes in today for the above.  At his last office visit he was establishing care to our office.  At that time we started him on hydrochlorothiazide due to his hypertension.  Since starting the medication he tells me he has been noticing more of a headache on the left side of his temple.  It does come and go intermittently.  He notices that laying on the left side seems to make the headache worse.  He will take some over-the-counter medication including ibuprofen at times which will help the headache but only mildly.  He believes that stress is a trigger to this headache.  He has also felt that he has had a bit of a mental fog and some fatigue concern hydrochlorothiazide.  He was referred to cardiology because he was having some chest pain and palpitations.  They are scheduled to see him next month.  All blood work that was collected at last office visit was generally normal this includes a metabolic panel, lipid panel, complete blood count, TSH, and A1c.  His HDL was a little bit low but otherwise everything else was normal.  He has no other complaints today.   Past Medical History:  Diagnosis Date  . Anxiety   . DDD (degenerative disc disease)    saw Dr. Ace Gins  . Hypertension   . Smoking       Family History  Problem Relation Age of Onset  . Arthritis Father   . Hypertension Father   . Hyperlipidemia Father   . Asthma Son   . COPD Maternal Grandmother   . Colon cancer Maternal Grandfather   . Heart disease Paternal Grandfather   . Colon cancer Maternal Aunt   . Breast cancer Maternal Aunt     Social History   Social History Narrative  . Not on file   Social History   Tobacco Use  . Smoking status: Current Every Day Smoker    Packs/day: 1.00     Types: Cigarettes  . Smokeless tobacco: Never Used  . Tobacco comment: occ vapes  Substance Use Topics  . Alcohol use: No     Current Meds  Medication Sig  . ibuprofen (ADVIL) 200 MG tablet Take 400 mg by mouth every 6 (six) hours as needed for headache or moderate pain.  . [DISCONTINUED] hydrochlorothiazide (HYDRODIURIL) 12.5 MG tablet Take 1 tablet (12.5 mg total) by mouth daily.    ROS:  Review of Systems  Eyes: Negative for blurred vision.  Neurological: Positive for headaches. Negative for dizziness, sensory change and weakness.       (+) mental fog      Objective:   Today's Vitals: BP 140/80 (BP Location: Left Arm, Patient Position: Sitting, Cuff Size: Normal)   Pulse (!) 101   Temp 98.1 F (36.7 C) (Temporal)   Ht _0  (1.88 m)   Wt 167 lb (75.8 kg)   SpO2 97%   BMI 21.44 kg/m  Vitals with BMI 11/17/2019 10/30/2019 05/20/2019  Height _1  _2  -  Weight 167 lbs 170 lbs 13 oz -  BMI 73.71 06.26 -  Systolic 948 546 270  Diastolic 80 350 73  Pulse 101 93 64  Physical Exam Vitals reviewed.  Constitutional:      Appearance: Normal appearance.  HENT:     Head: Normocephalic and atraumatic.  Cardiovascular:     Rate and Rhythm: Normal rate and regular rhythm.  Pulmonary:     Effort: Pulmonary effort is normal.     Breath sounds: Normal breath sounds.  Musculoskeletal:     Cervical back: Neck supple.  Skin:    General: Skin is warm and dry.  Neurological:     Mental Status: He is alert and oriented to person, place, and time.  Psychiatric:        Mood and Affect: Mood normal.        Behavior: Behavior normal.        Thought Content: Thought content normal.        Judgment: Judgment normal.          Assessment and Plan   1. Essential hypertension      Plan: 1.  His headaches may very well be related to his hypertension.  It still slightly above goal at home.  Tells me his at home readings are approximately 150/90-100.  Per shared decision  making we will stop the hydrochlorothiazide and change him to losartan.  I still will check a CMP today to monitor kidney function.  We also discussed following a healthy diet full of fresh [employer], and avoiding processed carbohydrates to help control his blood pressure.  We also discussed mindful meditation and trying to do this 2 to 5 minutes before bed can help with his blood pressure as well.  He tells me he will try to make these changes.   Tests ordered Orders Placed This Encounter  Procedures  . CMP with eGFR([facility])      Meds ordered this encounter  Medications  . losartan (COZAAR) 25 MG tablet    Sig: Take 1 tablet (25 mg total) by mouth daily.    Dispense:  90 tablet    Refill:  0    Order Specific Question:   Supervising Provider    Answer:   Doree Albee [[id number]]    Patient to follow-up in 2 to 3 weeks to check blood pressure and repeat CMP.  Ailene Ards, NP

## 2019-11-17 NOTE — Patient Instructions (Signed)
[facility] Dietary Recommendations for Weight Loss What to Avoid . Avoid added sugars o Often added sugar can be found in processed foods such as many condiments, dry cereals, cakes, cookies, chips, crisps, crackers, candies, sweetened drinks, etc.  o Read labels and AVOID/DECREASE use of foods with the following in their ingredient list: Sugar, fructose, high fructose corn syrup, sucrose, glucose, maltose, dextrose, molasses, cane sugar, brown sugar, any type of syrup, agave nectar, etc.   . Avoid snacking in between meals . Avoid foods made with flour o If you are going to eat food made with flour, choose those made with whole-grains; and, minimize your consumption as much as is tolerable . Avoid processed foods o These foods are generally stocked in the middle of the grocery store. Focus on shopping on the perimeter of the grocery.  . Avoid Meat  o We recommend following a plant-based diet at [facility]. Thus, we recommend avoiding meat as a general rule. Consider eating beans, legumes, eggs, and/or dairy products for regular protein sources o If you plan on eating meat limit to 4 ounces of meat at a time and choose lean options such as Fish, chicken, turkey. Avoid red meat intake such as pork and/or steak What to Include . Vegetables o GREEN LEAFY VEGETABLES: Kale, spinach, mustard greens, collard greens, cabbage, broccoli, etc. o OTHER: Asparagus, cauliflower, eggplant, carrots, peas, Brussel sprouts, tomatoes, bell peppers, zucchini, beets, cucumbers, etc. . Grains, seeds, and legumes o Beans: kidney beans, black eyed peas, garbanzo beans, black beans, pinto beans, etc. o Whole, unrefined grains: brown rice, barley, bulgur, oatmeal, etc. . Healthy fats  o Avoid highly processed fats such as vegetable oil o Examples of healthy fats: avocado, olives, virgin olive oil, dark chocolate (?72% Cocoa), nuts (peanuts, almonds, walnuts, cashews, pecans, etc.) . None to Low  Intake of Animal Sources of Protein o Meat sources: chicken, turkey, salmon, tuna. Limit to 4 ounces of meat at one time. o Consider limiting dairy sources, but when choosing dairy focus on: PLAIN Greek yogurt, cottage cheese, high-protein milk . Fruit o Choose berries  When to Eat . Intermittent Fasting: o Choosing not to eat for a specific time period, but DO FOCUS ON HYDRATION when fasting o Multiple Techniques: - Time Restricted Eating: eat 3 meals in a day, each meal lasting no more than 60 minutes, no snacks between meals - 16-18 hour fast: fast for 16 to 18 hours up to 7 days a week. Often suggested to start with 2-3 nonconsecutive days per week.  . Remember the time you sleep is counted as fasting.  . Examples of eating schedule: Fast from 7:00pm-11:00am. Eat between 11:00am-7:00pm.  - 24-hour fast: fast for 24 hours up to every other day. Often suggested to start with 1 day per week . Remember the time you sleep is counted as fasting . Examples of eating schedule:  o Eating day: eat 2-3 meals on your eating day. If doing 2 meals, each meal should last no more than 90 minutes. If doing 3 meals, each meal should last no more than 60 minutes. Finish last meal by 7:00pm. o Fasting day: Fast until 7:00pm.  o IF YOU FEEL UNWELL FOR ANY REASON/IN ANY WAY WHEN FASTING, STOP FASTING BY EATING A NUTRITIOUS SNACK OR LIGHT MEAL o ALWAYS FOCUS ON HYDRATION DURING FASTS - Acceptable Hydration sources: water, broths, tea/coffee (black tea/coffee is best but using a small amount of whole-fat dairy products in coffee/tea is acceptable).  -   Poor Hydration Sources: anything with sugar or artificial sweeteners added to it  These recommendations have been developed for patients that are actively receiving medical care from either Dr. Gos[facility]i or Analisia Kingsford, DNP, NP-C at [facility]. These recommendations are developed for patients with specific medical conditions and are not meant to be  distributed or used by others that are not actively receiving care from either provider listed above at [facility]. It is not appropriate to participate in the above eating plans without proper medical supervision.   Reference: Fung, J. The obesity code. [place]: [place]; 2016.   

## 2019-11-18 ENCOUNTER — Encounter (INDEPENDENT_AMBULATORY_CARE_PROVIDER_SITE_OTHER): Payer: Self-pay | Admitting: Nurse Practitioner

## 2019-11-18 DIAGNOSIS — I10 Essential (primary) hypertension: Secondary | ICD-10-CM | POA: Insufficient documentation

## 2019-11-18 LAB — COMPLETE METABOLIC PANEL WITH GFR
AG Ratio: 1.6 (calc) (ref 1.0–2.5)
ALT: 12 U/L (ref 9–46)
AST: 16 U/L (ref 10–40)
Albumin: 4.1 g/dL (ref 3.6–5.1)
Alkaline phosphatase (APISO): 73 U/L (ref 36–130)
BUN: 13 mg/dL (ref 7–25)
CO2: 27 mmol/L (ref 20–32)
Calcium: 9.4 mg/dL (ref 8.6–10.3)
Chloride: 104 mmol/L (ref 98–110)
Creat: 1.05 mg/dL (ref 0.60–1.35)
GFR, Est African American: 100 mL/min/{1.73_m2} (ref >=60)
GFR, Est Non African American: 87 mL/min/{1.73_m2} (ref >=60)
Globulin: 2.6 g/dL (calc) (ref 1.9–3.7)
Glucose, Bld: 78 mg/dL (ref 65–99)
Potassium: 3.7 mmol/L (ref 3.5–5.3)
Sodium: 140 mmol/L (ref 135–146)
Total Bilirubin: 1.1 mg/dL (ref 0.2–1.2)
Total Protein: 6.7 g/dL (ref 6.1–8.1)

## 2019-12-02 ENCOUNTER — Ambulatory Visit (INDEPENDENT_AMBULATORY_CARE_PROVIDER_SITE_OTHER): Payer: Medicaid Other | Admitting: Nurse Practitioner

## 2019-12-02 ENCOUNTER — Other Ambulatory Visit: Payer: Self-pay

## 2019-12-02 ENCOUNTER — Encounter (INDEPENDENT_AMBULATORY_CARE_PROVIDER_SITE_OTHER): Payer: Self-pay | Admitting: Nurse Practitioner

## 2019-12-02 VITALS — BP 130/85 | HR 83 | Temp 97.5°F | Ht 74.0 in | Wt 174.4 lb

## 2019-12-02 DIAGNOSIS — R61 Generalized hyperhidrosis: Secondary | ICD-10-CM

## 2019-12-02 DIAGNOSIS — I10 Essential (primary) hypertension: Secondary | ICD-10-CM

## 2019-12-02 DIAGNOSIS — Z113 Encounter for screening for infections with a predominantly sexual mode of transmission: Secondary | ICD-10-CM

## 2019-12-02 DIAGNOSIS — Z0001 Encounter for general adult medical examination with abnormal findings: Secondary | ICD-10-CM

## 2019-12-02 DIAGNOSIS — Z1159 Encounter for screening for other viral diseases: Secondary | ICD-10-CM | POA: Diagnosis not present

## 2019-12-02 NOTE — Progress Notes (Signed)
Subjective:  Patient ID: Curtis Snow, male    DOB: 17-Nov-1975  Age: 44 y.o. MRN: [id number]  CC:  Chief Complaint  Patient presents with  . Hypertension  . Follow-up      HPI  This patient presents today for annual physical exam as well as for follow-up of his hypertension.  At his last office visit we changed from hydrochlorothiazide to losartan.  He tells me has been tolerating this change well.  He will be due for metabolic check today.  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  He believes that he has had a tetanus shot within the last 5 years and has not yet had the COVID-19 vaccine series and he is contemplating whether or not he is getting get this.  As for health maintenance he would be due for STI screening, depression screening, hepatitis C screening.  He also mentions that he has been having some excessive sweating especially under his armpits.  He does not note any triggers for the sweating, and it is not affected by activity.  He is concerned about why this could be happening.  He has not noticed any fevers, unintentional weight loss, or significant changes to energy levels.  He has been experiencing cardiac palpitations and atypical chest pain, he has been referred to cardiology for further evaluation.  He does tell me since changing to losartan he is not feeling the cardiac palpitations as frequently.   Past Medical History:  Diagnosis Date  . Anxiety   . DDD (degenerative disc disease)    saw Dr. Ace Gins  . Hypertension   . Smoking       Family History  Problem Relation Age of Onset  . Arthritis Father   . Hypertension Father   . Hyperlipidemia Father   . Asthma Son   . COPD Maternal Grandmother   . Colon cancer Maternal Grandfather   . Heart disease Paternal Grandfather   . Colon cancer Maternal Aunt   . Breast cancer Maternal Aunt     Social History   Social History  Narrative  . Not on file   Social History   Tobacco Use  . Smoking status: Current Every Day Smoker    Packs/day: 1.00    Types: Cigarettes  . Smokeless tobacco: Never Used  . Tobacco comment: occ vapes  Substance Use Topics  . Alcohol use: No     Current Meds  Medication Sig  . ibuprofen (ADVIL) 200 MG tablet Take 400 mg by mouth every 6 (six) hours as needed for headache or moderate pain.  Marland Kitchen losartan (COZAAR) 25 MG tablet Take 1 tablet (25 mg total) by mouth daily.    ROS:  Negative unless otherwise stated in HPI   Objective:   Today's Vitals: BP 130/85 (BP Location: Left Arm, Patient Position: Sitting, Cuff Size: Normal)   Pulse 83   Temp (!) 97.5 F (36.4 C) (Temporal)   Ht 6' 2"  (1.88 m)   Wt 174 lb 6.4 oz (79.1 kg)   SpO2 98%   BMI 22.39 kg/m  Vitals with BMI 12/02/2019 11/17/2019 10/30/2019  Height 6' 2"  6' 2"  6' 2"   Weight 174 lbs 6 oz 167 lbs 170 lbs 13 oz  BMI 22.38 62.83 15.17  Systolic 616 073 710  Diastolic 85 80 626  Pulse 83 101 93     PHQ9 SCORE ONLY 12/02/2019  PHQ-9 Total Score 0  Physical Exam Vitals reviewed.  Constitutional:      General: He is not in acute distress.    Appearance: Normal appearance. He is not ill-appearing.  HENT:     Head: Normocephalic and atraumatic.     Right Ear: Tympanic membrane, ear canal and external ear normal.     Left Ear: Tympanic membrane, ear canal and external ear normal.  Eyes:     General: No scleral icterus.    Extraocular Movements: Extraocular movements intact.     Conjunctiva/sclera: Conjunctivae normal.     Pupils: Pupils are equal, round, and reactive to light.  Neck:     Vascular: No carotid bruit.  Cardiovascular:     Rate and Rhythm: Normal rate and regular rhythm.     Pulses: Normal pulses.     Heart sounds: Normal heart sounds.  Pulmonary:     Effort: Pulmonary effort is normal.     Breath sounds: Normal breath sounds.  Abdominal:     General: Bowel sounds are normal. There is no  distension.     Palpations: There is no mass.     Tenderness: There is no abdominal tenderness.     Hernia: No hernia is present.  Musculoskeletal:        General: No swelling or tenderness.     Cervical back: Normal range of motion and neck supple. No rigidity.  Lymphadenopathy:     Cervical: No cervical adenopathy.  Skin:    General: Skin is warm and dry.  Neurological:     General: No focal deficit present.     Mental Status: He is alert and oriented to person, place, and time.     Cranial Nerves: No cranial nerve deficit.     Sensory: No sensory deficit.     Motor: No weakness.     Gait: Gait normal.  Psychiatric:        Mood and Affect: Mood normal.        Behavior: Behavior normal.        Judgment: Judgment normal.          Assessment and Plan   1. Encounter for general adult medical examination with abnormal findings   2. Essential hypertension   3. Encounter for hepatitis C screening test for low risk patient   4. Routine screening for STI (sexually transmitted infection)   5. Hyperhidrosis      Plan: 1, 3.-4.  We did discuss the COVID-19 vaccine I did recommend that he consider getting this administered, he will think about it.  Up-to-date with other immunizations.  He would like to undergo STI screening, hepatitis C screening, depression screen was negative today. 2.  We will check CMP today since we changed to losartan at last office visit.  Blood pressure is much better controlled now and we will continue him on this dose assuming his metabolic panel remained stable. 5.  I encouraged him to follow-up with cardiology as scheduled.  In addition I will check sed rate, CRP, QuantiFERON gold, and the sexually transmitted infection screening as stated above.  Further recommendations may be made based upon these results.    Tests ordered Orders Placed This Encounter  Procedures  . CMP with eGFR([facility])  . Hep C Antibody  . HIV antibody (with reflex)  .  Sedimentation Rate  . C-reactive Protein  . QuantiFERON-TB Gold Plus  . RPR      No orders of the defined types were placed in this encounter.   Patient to  follow-up in 6 months or sooner as needed.  In addition to performing his annual physical exam I also performed an office visit to address his hypertension concerns of hyperhidrosis.  Ailene Ards, NP

## 2019-12-05 LAB — COMPLETE METABOLIC PANEL WITH GFR
AG Ratio: 1.6 (calc) (ref 1.0–2.5)
ALT: 24 U/L (ref 9–46)
AST: 21 U/L (ref 10–40)
Albumin: 4.2 g/dL (ref 3.6–5.1)
Alkaline phosphatase (APISO): 76 U/L (ref 36–130)
BUN: 10 mg/dL (ref 7–25)
CO2: 26 mmol/L (ref 20–32)
Calcium: 9.2 mg/dL (ref 8.6–10.3)
Chloride: 104 mmol/L (ref 98–110)
Creat: 0.92 mg/dL (ref 0.60–1.35)
GFR, Est African American: 118 mL/min/{1.73_m2} (ref >=60)
GFR, Est Non African American: 102 mL/min/{1.73_m2} (ref >=60)
Globulin: 2.6 g/dL (calc) (ref 1.9–3.7)
Glucose, Bld: 82 mg/dL (ref 65–99)
Potassium: 4.6 mmol/L (ref 3.5–5.3)
Sodium: 141 mmol/L (ref 135–146)
Total Bilirubin: 0.5 mg/dL (ref 0.2–1.2)
Total Protein: 6.8 g/dL (ref 6.1–8.1)

## 2019-12-05 LAB — HIV ANTIBODY (ROUTINE TESTING W REFLEX): HIV 1&2 Ab, 4th Generation: NONREACTIVE

## 2019-12-05 LAB — QUANTIFERON-TB GOLD PLUS
Mitogen-NIL: 8.88 IU/mL
NIL: 0.03 IU/mL
QuantiFERON-TB Gold Plus: NEGATIVE
TB1-NIL: 0.01 IU/mL
TB2-NIL: 0 IU/mL

## 2019-12-05 LAB — RPR: RPR Ser Ql: NONREACTIVE

## 2019-12-05 LAB — HEPATITIS C ANTIBODY
Hepatitis C Ab: NONREACTIVE
SIGNAL TO CUT-OFF: 0.02 (ref <1.00)

## 2019-12-05 LAB — C-REACTIVE PROTEIN: CRP: 2.2 mg/L (ref <8.0)

## 2019-12-05 LAB — SEDIMENTATION RATE: Sed Rate: 2 mm/h (ref 0–15)

## 2019-12-10 ENCOUNTER — Encounter: Payer: Self-pay | Admitting: Emergency Medicine

## 2019-12-10 ENCOUNTER — Telehealth (INDEPENDENT_AMBULATORY_CARE_PROVIDER_SITE_OTHER): Payer: Self-pay | Admitting: Internal Medicine

## 2019-12-10 ENCOUNTER — Ambulatory Visit
Admission: EM | Admit: 2019-12-10 | Discharge: 2019-12-10 | Disposition: A | Discharge status: Home / Self Care | Payer: BC Managed Care – PPO | Attending: Emergency Medicine | Admitting: Emergency Medicine

## 2019-12-10 ENCOUNTER — Other Ambulatory Visit: Payer: Self-pay

## 2019-12-10 DIAGNOSIS — G4489 Other headache syndrome: Secondary | ICD-10-CM

## 2019-12-10 DIAGNOSIS — R110 Nausea: Secondary | ICD-10-CM

## 2019-12-10 MED ORDER — ONDANSETRON HCL 4 MG PO TABS: 4.0000 mg | ORAL_TABLET | Freq: Four times a day (QID) | ORAL | 0 refills | Status: DC

## 2019-12-10 NOTE — Discharge Instructions (Signed)
Unable to rule out stroke in urgent care setting.  Offered patient further evaluation and management in the ED.  Patient declines at this time and would like to try outpatient therapy first.  Aware of the risk associated with this decision including missed diagnosis, organ damage, organ failure, and/or death.  Patient aware and in agreement.     Declines COVID test Declines migraine cocktail Rest and drink plenty of fluids Try taking blood pressure medication at night time to avoid side effects Take OTC ibuprofen/ tylenol as needed for headache Zofran prescribed.  Take as needed for nausea Follow up with PCP next week for recheck Return or go to the ER if you have any new or worsening symptoms such as fever, chills, nausea, vomiting, chest pain, shortness of breath, cough, vision changes, worsening headache despite treatment, slurred speech, facial asymmetry, weakness in arms or legs, etc..Marland Kitchen

## 2019-12-10 NOTE — ED Triage Notes (Signed)
Dizziness, headache and nausea since this morning. Pt states he only has a headache at this moment.  Pt just recently dx with HTN and put on new medication.

## 2019-12-10 NOTE — ED Provider Notes (Signed)
[place]   [id number] 12/10/19 Arrival Time: 5366  CC: HEADACHE  SUBJECTIVE:  Curtis Snow is a 44 y.o. male who complains of HA x this morning.  Recently started on blood pressure medication.  Denies trauma or head injury.  Patient localizes his pain to the base of skull.  Describes the pain as constant and dull/ achy in character.  Patient has NOT tried OTC medications. Symptoms are made worse with standing from a seated position.   This is not the worst headache of their life. Complains of associated intermittent dizziness/ lightheadedness that last a few minutes at a time with standing from a seated position, and nausea.  Patient denies fever, chills, vomiting, vision changes, blurred vision, chest pain, SOB, abdominal pain, weakness, numbness or tingling, slurred speech.     ROS: As per HPI.  All other pertinent ROS negative.     Past Medical History:  Diagnosis Date   Anxiety    DDD (degenerative disc disease)    saw Dr. Ace Gins   Hypertension    Smoking    Past Surgical History:  Procedure Laterality Date   COLONOSCOPY N/A 05/20/2019   Procedure: COLONOSCOPY;  Surgeon: Daneil Dolin, MD;  Location: [facility];  Service: Endoscopy;  Laterality: N/A;  1:30pm   NO PAST SURGERIES     POLYPECTOMY  05/20/2019   Procedure: POLYPECTOMY;  Surgeon: Daneil Dolin, MD;  Location: [facility];  Service: Endoscopy;;  hepatic flexure   Allergies  Allergen Reactions   Penicillins Shortness Of Breath, Nausea And Vomiting and Rash    Did it involve swelling of the face/tongue/throat, SOB, or low BP? Yes Did it involve sudden or severe rash/hives, skin peeling, or any reaction on the inside of your mouth or nose? Yes Did you need to seek medical attention at a hospital or doctor's office? Yes When did it last happen?childhood allergy If all above answers are NO, may proceed with cephalosporin use.    No current facility-administered medications on  file prior to encounter.   Current Outpatient Medications on File Prior to Encounter  Medication Sig Dispense Refill   ibuprofen (ADVIL) 200 MG tablet Take 400 mg by mouth every 6 (six) hours as needed for headache or moderate pain.     losartan (COZAAR) 25 MG tablet Take 1 tablet (25 mg total) by mouth daily. 90 tablet 0   Social History   Socioeconomic History   Marital status: Married    Spouse name: Not on file   Number of children: Not on file   Years of education: Not on file   Highest education level: Not on file  Occupational History   Occupation: welder  Tobacco Use   Smoking status: Current Every Day Smoker    Packs/day: 1.00    Types: Cigarettes   Smokeless tobacco: Never Used   Tobacco comment: occ vapes  Substance and Sexual Activity   Alcohol use: No   Drug use: No   Sexual activity: Yes    Birth control/protection: None  Other Topics Concern   Not on file  Social History Narrative   Not on file   Social Determinants of Health   Financial Resource Strain:    Difficulty of Paying Living Expenses:   Food Insecurity:    Worried About Charity fundraiser in the Last Year:    Arboriculturist in the Last Year:   Transportation Needs:    Film/video editor (Medical):  Lack of Transportation (Non-Medical):   Physical Activity:    Days of Exercise per Week:    Minutes of Exercise per Session:   Stress:    Feeling of Stress :   Social Connections:    Frequency of Communication with Friends and Family:    Frequency of Social Gatherings with Friends and Family:    Attends Religious Services:    Active Member of Clubs or Organizations:    Attends Music therapist:    Marital Status:   Intimate Partner Violence:    Fear of Current or Ex-Partner:    Emotionally Abused:    Physically Abused:    Sexually Abused:    Family History  Problem Relation Age of Onset   Arthritis Father    Hypertension Father      Hyperlipidemia Father    Asthma Son    COPD Maternal Grandmother    Colon cancer Maternal Grandfather    Heart disease Paternal Grandfather    Colon cancer Maternal Aunt    Breast cancer Maternal Aunt     OBJECTIVE:  Vitals:   12/10/19 0909 12/10/19 0910  BP:  (!) 154/79  Pulse:  69  Resp:  19  Temp:  97.9 F (36.6 C)  TempSrc:  Oral  SpO2:  96%  Weight: 174 lb 2.6 oz (79 kg)   Height: 6\' 2"  (1.88 m)     General appearance: alert; no distress Eyes: PERRLA; EOMI HENT: normocephalic; atraumatic; EACs clear, TMs pearly gray; nares patent; oropharynx clear Neck: supple with FROM Lungs: clear to auscultation bilaterally Heart: regular rate and rhythm.   Extremities: no edema; symmetrical with no gross deformities Skin: warm and dry Neurologic: CN 2-12 grossly intact; finger to nose without difficulty; normal gait; strength and sensation intact bilaterally about the upper and lower extremities; negative pronator drift Psychological: alert and cooperative; normal mood and affect   ASSESSMENT & PLAN:  1. Other headache syndrome   2. Nausea without vomiting     Meds ordered this encounter  Medications   ondansetron (ZOFRAN) 4 MG tablet    Sig: Take 1 tablet (4 mg total) by mouth every 6 (six) hours.    Dispense:  12 tablet    Refill:  0    Order Specific Question:   Supervising Provider    Answer:   Raylene Everts [[id number]]   Unable to rule out stroke in urgent care setting.  Offered patient further evaluation and management in the ED.  Patient declines at this time and would like to try outpatient therapy first.  Aware of the risk associated with this decision including missed diagnosis, organ damage, organ failure, and/or death.  Patient aware and in agreement.     Declines COVID test Declines migraine cocktail Rest and drink plenty of fluids Try taking blood pressure medication at night time to avoid side effects Take OTC ibuprofen/ tylenol as needed  for headache Zofran prescribed.  Take as needed for nausea Follow up with PCP next week for recheck Return or go to the ER if you have any new or worsening symptoms such as fever, chills, nausea, vomiting, chest pain, shortness of breath, cough, vision changes, worsening headache despite treatment, slurred speech, facial asymmetry, weakness in arms or legs, etc...  Reviewed expectations re: course of current medical issues. Questions answered. Outlined signs and symptoms indicating need for more acute intervention. Patient verbalized understanding. After Visit Summary given.   Lestine Box, PA-C 12/10/19 [phone]

## 2019-12-10 NOTE — Telephone Encounter (Signed)
He will need to go to the urgent care.  Thanks.

## 2019-12-15 ENCOUNTER — Ambulatory Visit: Payer: BC Managed Care – PPO | Admitting: Cardiology

## 2020-01-13 ENCOUNTER — Ambulatory Visit: Payer: BC Managed Care – PPO | Admitting: Nurse Practitioner

## 2020-01-13 ENCOUNTER — Encounter: Payer: Self-pay | Admitting: Internal Medicine

## 2020-01-13 NOTE — Progress Notes (Deleted)
Referring Provider: Doree Albee, MD Primary Care Physician:  Doree Albee, MD Primary GI:  Dr. Gala Romney  No chief complaint on file.   HPI:   Curtis Snow is a 44 y.o. male who presents for follow-up on rectal bleeding.  The patient was last seen in our office 05/13/2019 for rectal bleeding.  Initial referral from urgent care due to hemorrhoids with intermittent rectal bleeding.  No previous history of colonoscopy at that time.  Noted rectal bleeding mostly scant toilet tissue hematochezia with occasional episodes of heavier bleeding about once a week, heavier bleeding occurs with large meals.  Denies overt constipation but stools are regular with a bowel movement twice a day typically [place] 4.  Rare hard stools but occasionally strains.  No persistent or ongoing abdominal pain.  No other overt GI complaints.  Recommended labs, colonoscopy, follow-up in 6 months.  Labs were not completed as ordered (CBC).  However a CBC was completed 10/30/2019 which found normal hemoglobin at 16.3.  Colonoscopy completed 05/20/2019 which found four 4 to 5 mm polyps in the descending colon at hepatic flexure, nonbleeding internal hemorrhoids, otherwise normal.  Surgical pathology found the polyps to be tubular adenoma.  Recommended repeat colonoscopy in 3 years (2024).  Today states doing okay overall.   Past Medical History:  Diagnosis Date  . Anxiety   . DDD (degenerative disc disease)    saw Dr. Ace Gins  . Hypertension   . Smoking     Past Surgical History:  Procedure Laterality Date  . COLONOSCOPY N/A 05/20/2019   Procedure: COLONOSCOPY;  Surgeon: Daneil Dolin, MD;  [facility];  Service: Endoscopy;  Laterality: N/A;  1:30pm  . NO PAST SURGERIES    . POLYPECTOMY  05/20/2019   Procedure: POLYPECTOMY;  Surgeon: Daneil Dolin, MD;  [facility];  Service: Endoscopy;;  hepatic flexure    Current Outpatient Medications  Medication Sig Dispense Refill  .  ibuprofen (ADVIL) 200 MG tablet Take 400 mg by mouth every 6 (six) hours as needed for headache or moderate pain.    Marland Kitchen losartan (COZAAR) 25 MG tablet Take 1 tablet (25 mg total) by mouth daily. 90 tablet 0  . ondansetron (ZOFRAN) 4 MG tablet Take 1 tablet (4 mg total) by mouth every 6 (six) hours. 12 tablet 0   No current facility-administered medications for this visit.    Allergies as of 01/13/2020 - Review Complete 12/10/2019  Allergen Reaction Noted  . Penicillins Shortness Of Breath, Nausea And Vomiting, and Rash 06/27/2011    Family History  Problem Relation Age of Onset  . Arthritis Father   . Hypertension Father   . Hyperlipidemia Father   . Asthma Son   . COPD Maternal Grandmother   . Colon cancer Maternal Grandfather   . Heart disease Paternal Grandfather   . Colon cancer Maternal Aunt   . Breast cancer Maternal Aunt     Social History   Socioeconomic History  . Marital status: Married    Spouse name: Not on file  . Number of children: Not on file  . Years of education: Not on file  . Highest education level: Not on file  Occupational History  . Occupation: welder  Tobacco Use  . Smoking status: Current Every Day Smoker    Packs/day: 1.00    Types: Cigarettes  . Smokeless tobacco: Never Used  . Tobacco comment: occ vapes  Substance and Sexual Activity  . Alcohol use: No  .  Drug use: No  . Sexual activity: Yes    Birth control/protection: None  Other Topics Concern  . Not on file  Social History Narrative  . Not on file   Social Determinants of Health   Financial Resource Strain:   . Difficulty of Paying Living Expenses: Not on file  Food Insecurity:   . Worried About Charity fundraiser in the Last Year: Not on file  . Ran Out of Food in the Last Year: Not on file  Transportation Needs:   . Lack of Transportation (Medical): Not on file  . Lack of Transportation (Non-Medical): Not on file  Physical Activity:   . Days of Exercise per Week: Not on  file  . Minutes of Exercise per Session: Not on file  Stress:   . Feeling of Stress : Not on file  Social Connections:   . Frequency of Communication with Friends and Family: Not on file  . Frequency of Social Gatherings with Friends and Family: Not on file  . Attends Religious Services: Not on file  . Active Member of Clubs or Organizations: Not on file  . Attends Archivist Meetings: Not on file  . Marital Status: Not on file    Subjective: Review of Systems  Constitutional: Negative for chills, fever, malaise/fatigue and weight loss.  HENT: Negative for congestion and sore throat.   Respiratory: Negative for cough and shortness of breath.   Cardiovascular: Negative for chest pain and palpitations.  Gastrointestinal: Negative for abdominal pain, blood in stool, diarrhea, melena, nausea and vomiting.  Musculoskeletal: Negative for joint pain and myalgias.  Skin: Negative for rash.  Neurological: Negative for dizziness and weakness.  Endo/Heme/Allergies: Does not bruise/bleed easily.  Psychiatric/Behavioral: Negative for depression. The patient is not nervous/anxious.   All other systems reviewed and are negative.    Objective: There were no vitals taken for this visit. Physical Exam Vitals and nursing note reviewed.  Constitutional:      General: He is not in acute distress.    Appearance: Normal appearance. He is not ill-appearing, toxic-appearing or diaphoretic.  HENT:     Head: Normocephalic and atraumatic.     Nose: No congestion or rhinorrhea.  Eyes:     General: No scleral icterus. Cardiovascular:     Rate and Rhythm: Normal rate and regular rhythm.     Heart sounds: Normal heart sounds.  Pulmonary:     Effort: Pulmonary effort is normal.     Breath sounds: Normal breath sounds.  Abdominal:     General: Bowel sounds are normal. There is no distension.     Palpations: Abdomen is soft. There is no hepatomegaly, splenomegaly or mass.     Tenderness:  There is no abdominal tenderness. There is no guarding or rebound.     Hernia: No hernia is present.  Musculoskeletal:     Cervical back: Neck supple.  Skin:    General: Skin is warm and dry.     Coloration: Skin is not jaundiced.     Findings: No bruising or rash.  Neurological:     General: No focal deficit present.     Mental Status: He is alert and oriented to person, place, and time. Mental status is at baseline.  Psychiatric:        Mood and Affect: Mood normal.        Behavior: Behavior normal.        Thought Content: Thought content normal.      Assessment:  ***  Plan: ***    Thank you for allowing [place] to participate in the care of [facility], DNP, AGNP-C Adult & Gerontological Nurse Practitioner [facility]   01/13/2020 1:35 PM   Disclaimer: This note was dictated with voice recognition software. Similar sounding words can inadvertently be transcribed and may not be corrected upon review.

## 2020-02-17 ENCOUNTER — Other Ambulatory Visit (INDEPENDENT_AMBULATORY_CARE_PROVIDER_SITE_OTHER): Payer: Self-pay | Admitting: Nurse Practitioner

## 2020-02-17 DIAGNOSIS — I10 Essential (primary) hypertension: Secondary | ICD-10-CM

## 2020-03-03 ENCOUNTER — Ambulatory Visit (INDEPENDENT_AMBULATORY_CARE_PROVIDER_SITE_OTHER): Payer: BC Managed Care – PPO | Admitting: Internal Medicine

## 2020-03-09 ENCOUNTER — Ambulatory Visit: Payer: BC Managed Care – PPO | Admitting: Cardiology

## 2020-05-31 ENCOUNTER — Other Ambulatory Visit (INDEPENDENT_AMBULATORY_CARE_PROVIDER_SITE_OTHER): Payer: Self-pay | Admitting: Internal Medicine

## 2020-05-31 DIAGNOSIS — I10 Essential (primary) hypertension: Secondary | ICD-10-CM

## 2020-06-07 ENCOUNTER — Telehealth (INDEPENDENT_AMBULATORY_CARE_PROVIDER_SITE_OTHER): Payer: Self-pay | Admitting: Internal Medicine

## 2020-06-07 ENCOUNTER — Other Ambulatory Visit (INDEPENDENT_AMBULATORY_CARE_PROVIDER_SITE_OTHER): Payer: Self-pay | Admitting: Internal Medicine

## 2020-06-07 DIAGNOSIS — I10 Essential (primary) hypertension: Secondary | ICD-10-CM

## 2020-06-07 MED ORDER — LOSARTAN POTASSIUM 25 MG PO TABS: 25.0000 mg | ORAL_TABLET | Freq: Every day | ORAL | 0 refills | Status: DC

## 2020-06-08 NOTE — Telephone Encounter (Signed)
Done

## 2020-06-09 ENCOUNTER — Ambulatory Visit (INDEPENDENT_AMBULATORY_CARE_PROVIDER_SITE_OTHER): Payer: Medicaid Other | Admitting: Internal Medicine

## 2020-06-23 ENCOUNTER — Encounter (INDEPENDENT_AMBULATORY_CARE_PROVIDER_SITE_OTHER): Payer: Self-pay | Admitting: Internal Medicine

## 2020-06-23 ENCOUNTER — Other Ambulatory Visit: Payer: Self-pay

## 2020-06-23 ENCOUNTER — Ambulatory Visit (INDEPENDENT_AMBULATORY_CARE_PROVIDER_SITE_OTHER): Payer: Medicaid Other | Admitting: Internal Medicine

## 2020-06-23 VITALS — BP 144/88 | HR 89 | Temp 97.6°F | Resp 18 | Ht 74.0 in | Wt 186.6 lb

## 2020-06-23 DIAGNOSIS — I10 Essential (primary) hypertension: Secondary | ICD-10-CM

## 2020-06-23 MED ORDER — LOSARTAN POTASSIUM 50 MG PO TABS: 50.0000 mg | ORAL_TABLET | Freq: Every day | ORAL | 3 refills | Status: DC

## 2020-06-23 NOTE — Progress Notes (Signed)
Metrics: Intervention Frequency ACO  Documented Smoking Status Yearly  Screened one or more times in 24 months  Cessation Counseling or  Active cessation medication Past 24 months  Past 24 months   Guideline developer: UpToDate (See UpToDate for funding source) Date Released: 2014       Wellness Office Visit  Subjective:  Patient ID: Curtis Snow, male    DOB: 12-16-1975  Age: 45 y.o. MRN: [id number]  CC: This man comes in for follow-up after long hiatus for hypertension follow-up. HPI  He has been taking losartan 25 mg daily.  He has noticed that home blood pressure readings are elevated.  He denies any chest pain, dyspnea, palpitations or limb weakness. Past Medical History:  Diagnosis Date  . Anxiety   . DDD (degenerative disc disease)    saw Dr. Ace Snow  . Hypertension   . Smoking    Past Surgical History:  Procedure Laterality Date  . COLONOSCOPY N/A 05/20/2019   Procedure: COLONOSCOPY;  Surgeon: Curtis Dolin, MD;  [facility];  Service: Endoscopy;  Laterality: N/A;  1:30pm  . NO PAST SURGERIES    . POLYPECTOMY  05/20/2019   Procedure: POLYPECTOMY;  Surgeon: Curtis Dolin, MD;  [facility];  Service: Endoscopy;;  hepatic flexure     Family History  Problem Relation Age of Onset  . Arthritis Father   . Hypertension Father   . Hyperlipidemia Father   . Curtis Snow   . COPD Maternal Grandmother   . Colon cancer Maternal Grandfather   . Heart disease Paternal Grandfather   . Colon cancer Maternal Aunt   . Breast cancer Maternal Aunt     Social History   Social History Narrative   Married since 2014.Unemployed.Lives with wife and Snow.   Social History   Tobacco Use  . Smoking status: Current Every Day Smoker    Packs/day: 1.00    Types: Cigarettes  . Smokeless tobacco: Never Used  . Tobacco comment: occ vapes  Substance Use Topics  . Alcohol use: No    Current Meds  Medication Sig  . losartan (COZAAR) 50 MG tablet Take  1 tablet (50 mg total) by mouth daily.  . [DISCONTINUED] ibuprofen (ADVIL) 200 MG tablet Take 400 mg by mouth every 6 (six) hours as needed for headache or moderate pain.  . [DISCONTINUED] losartan (COZAAR) 25 MG tablet Take 1 tablet (25 mg total) by mouth daily.  . [DISCONTINUED] ondansetron (ZOFRAN) 4 MG tablet Take 1 tablet (4 mg total) by mouth every 6 (six) hours.       Objective:   Today's Vitals: BP (!) 144/88 (BP Location: Left Arm, Patient Position: Sitting, Cuff Size: Normal)   Pulse 89   Temp 97.6 F (36.4 C) (Temporal)   Resp 18   Ht 6\' 2"  (1.88 m)   Wt 186 lb 9.6 oz (84.6 kg)   SpO2 96%   BMI 23.96 kg/m  Vitals with BMI 06/23/2020 12/10/2019 12/02/2019  Height 6\' 2"  6\' 2"  6\' 2"   Weight 186 lbs 10 oz 174 lbs 3 oz 174 lbs 6 oz  BMI 23.95 36.64 40.34  Systolic 742 595 638  Diastolic 88 79 85  Pulse 89 69 83     Physical Exam  He looks systemically well.  He has gained weight since the last visit.  Blood pressure is elevated.     Assessment   1. Essential hypertension       Tests ordered No orders of the  defined types were placed in this encounter.    Plan: 1. His blood pressure is uncontrolled and I think we need to get better control.  I told him to increase the dose of losartan to 50 mg daily and I have sent a new prescription to reflect this new change. 2. He will follow-up with Curtis Snow in 1 month for close monitoring and also he will need renal function checked as we have made change in his losartan dose.   Meds ordered this encounter  Medications  . losartan (COZAAR) 50 MG tablet    Sig: Take 1 tablet (50 mg total) by mouth daily.    Dispense:  30 tablet    Refill:  3    Curtis Hochberg Luther Parody, MD

## 2020-07-29 ENCOUNTER — Ambulatory Visit (INDEPENDENT_AMBULATORY_CARE_PROVIDER_SITE_OTHER): Payer: Medicaid Other | Admitting: Nurse Practitioner

## 2020-07-29 ENCOUNTER — Encounter (INDEPENDENT_AMBULATORY_CARE_PROVIDER_SITE_OTHER): Payer: Self-pay | Admitting: Nurse Practitioner

## 2020-07-31 ENCOUNTER — Other Ambulatory Visit (INDEPENDENT_AMBULATORY_CARE_PROVIDER_SITE_OTHER): Payer: Self-pay | Admitting: Internal Medicine

## 2020-07-31 DIAGNOSIS — I10 Essential (primary) hypertension: Secondary | ICD-10-CM

## 2020-08-02 NOTE — Telephone Encounter (Signed)
Pt was called & message to re-schedule appt. To please  give a call today. Left voicemail to call & reschedule appt to see Sarah to monitor the Losartan change and blood work. In order to get a refill.

## 2020-08-23 ENCOUNTER — Other Ambulatory Visit (INDEPENDENT_AMBULATORY_CARE_PROVIDER_SITE_OTHER): Payer: Self-pay | Admitting: Internal Medicine

## 2020-08-23 ENCOUNTER — Ambulatory Visit (INDEPENDENT_AMBULATORY_CARE_PROVIDER_SITE_OTHER): Payer: Medicaid Other | Admitting: Internal Medicine

## 2020-08-23 ENCOUNTER — Encounter (INDEPENDENT_AMBULATORY_CARE_PROVIDER_SITE_OTHER): Payer: Self-pay | Admitting: Internal Medicine

## 2020-08-23 ENCOUNTER — Other Ambulatory Visit: Payer: Self-pay

## 2020-08-23 VITALS — BP 136/88 | HR 89 | Temp 97.9°F | Ht 74.0 in | Wt 181.6 lb

## 2020-08-23 DIAGNOSIS — I10 Essential (primary) hypertension: Secondary | ICD-10-CM

## 2020-08-23 NOTE — Progress Notes (Signed)
Metrics: Intervention Frequency ACO  Documented Smoking Status Yearly  Screened one or more times in 24 months  Cessation Counseling or  Active cessation medication Past 24 months  Past 24 months   Guideline developer: UpToDate (See UpToDate for funding source) Date Released: 2014       Wellness Office Visit  Subjective:  Patient ID: Curtis Snow, male    DOB: 12/23/1975  Age: 45 y.o. MRN: [id number]  CC: This man comes in for follow-up of hypertension. HPI  On his last visit, I increased losartan dose to 50 mg daily and he has been compliant with this dose. He tells me he has been under a lot of stress recently in terms of his work, home and his son.  He denies any chest pain, dyspnea, palpitations or limb weakness. Past Medical History:  Diagnosis Date  . Anxiety   . DDD (degenerative disc disease)    saw Dr. Ace Gins  . Hypertension   . Smoking    Past Surgical History:  Procedure Laterality Date  . COLONOSCOPY N/A 05/20/2019   Procedure: COLONOSCOPY;  Surgeon: Daneil Dolin, MD;  [facility];  Service: Endoscopy;  Laterality: N/A;  1:30pm  . NO PAST SURGERIES    . POLYPECTOMY  05/20/2019   Procedure: POLYPECTOMY;  Surgeon: Daneil Dolin, MD;  [facility];  Service: Endoscopy;;  hepatic flexure     Family History  Problem Relation Age of Onset  . Arthritis Father   . Hypertension Father   . Hyperlipidemia Father   . Asthma Son   . COPD Maternal Grandmother   . Colon cancer Maternal Grandfather   . Heart disease Paternal Grandfather   . Colon cancer Maternal Aunt   . Breast cancer Maternal Aunt     Social History   Social History Narrative   Married since 2014.Unemployed.Lives with wife and son.   Social History   Tobacco Use  . Smoking status: Current Every Day Smoker    Packs/day: 1.00    Types: Cigarettes  . Smokeless tobacco: Never Used  . Tobacco comment: occ vapes  Substance Use Topics  . Alcohol use: No     Current Meds  Medication Sig  . losartan (COZAAR) 50 MG tablet Take 1 tablet (50 mg total) by mouth daily.     [facility] Office Visit from 08/23/2020 in [facility]  PHQ-9 Total Score 0      Objective:   Today's Vitals: BP 136/88   Pulse 89   Temp 97.9 F (36.6 C) (Temporal)   Ht 6\' 2"  (1.88 m)   Wt 181 lb 9.6 oz (82.4 kg)   SpO2 97%   BMI 23.32 kg/m  Vitals with BMI 08/23/2020 06/23/2020 12/10/2019  Height 6\' 2"  6\' 2"  6\' 2"   Weight 181 lbs 10 oz 186 lbs 10 oz 174 lbs 3 oz  BMI 23.31 01.09 32.35  Systolic 573 220 254  Diastolic 88 88 79  Pulse 89 89 69     Physical Exam   Although his blood pressure is not optimally controlled, it is definitely improving.  He is alert and orientated without any focal neuro signs    Assessment   1. Essential hypertension       Tests ordered Orders Placed This Encounter  Procedures  . Basic metabolic panel     Plan: 1. Continue with the same dose of losartan 50 mg daily and we will check renal function today. 2. I recommended he purchase  a blood pressure machine and keep a check on his blood pressure 3 times a week, taking 3 measurements every time.  He will then bring this machine and on his next visit with Judson Roch in August.   No orders of the defined types were placed in this encounter.   Doree Albee, MD

## 2020-08-24 ENCOUNTER — Encounter (INDEPENDENT_AMBULATORY_CARE_PROVIDER_SITE_OTHER): Payer: Self-pay | Admitting: Internal Medicine

## 2020-08-24 LAB — BASIC METABOLIC PANEL
BUN: 8 mg/dL (ref 7–25)
CO2: 26 mmol/L (ref 20–32)
Calcium: 9 mg/dL (ref 8.6–10.3)
Chloride: 106 mmol/L (ref 98–110)
Creat: 0.92 mg/dL (ref 0.60–1.35)
Glucose, Bld: 122 mg/dL (ref 65–139)
Potassium: 3.9 mmol/L (ref 3.5–5.3)
Sodium: 140 mmol/L (ref 135–146)

## 2020-10-13 ENCOUNTER — Other Ambulatory Visit: Payer: Self-pay

## 2020-10-13 ENCOUNTER — Ambulatory Visit
Admission: EM | Admit: 2020-10-13 | Discharge: 2020-10-13 | Disposition: A | Discharge status: Home / Self Care | Payer: Medicaid Other | Attending: Family Medicine | Admitting: Family Medicine

## 2020-10-13 ENCOUNTER — Encounter: Payer: Self-pay | Admitting: Emergency Medicine

## 2020-10-13 DIAGNOSIS — R062 Wheezing: Secondary | ICD-10-CM

## 2020-10-13 DIAGNOSIS — J069 Acute upper respiratory infection, unspecified: Secondary | ICD-10-CM | POA: Diagnosis not present

## 2020-10-13 MED ORDER — PREDNISONE 20 MG PO TABS: 40.0000 mg | ORAL_TABLET | Freq: Every day | ORAL | 0 refills | Status: DC

## 2020-10-13 NOTE — ED Triage Notes (Signed)
Pain to LT side of head, ear and throat that started yesterday evening.

## 2020-10-13 NOTE — ED Provider Notes (Addendum)
[facility]   [id number] 10/13/20 Arrival Time: 7867  ASSESSMENT & PLAN:  1. Viral URI with cough   2. Wheezing    Discussed typical duration of viral illnesses. OTC symptom care as needed.  Begin: Meds ordered this encounter  Medications   predniSONE (DELTASONE) 20 MG tablet    Sig: Take 2 tablets (40 mg total) by mouth daily.    Dispense:  10 tablet    Refill:  0     Follow-up Information     Ailene Ards, NP.   Specialty: Nurse Practitioner Why: As needed. Contact information: [place] [address] [phone]                 Reviewed expectations re: course of current medical issues. Questions answered. Outlined signs and symptoms indicating need for more acute intervention. Understanding verbalized. After Visit Summary given.   SUBJECTIVE: History from: patient. BAYDEN GIL is a 45 y.o. male who reports head congestion, L otalgia/pressure, ST. Abrupt onset last evening. Denies: fever and difficulty breathing. Normal PO intake without n/v/d.  Social History   Tobacco Use  Smoking Status Every Day   Packs/day: 1.00   Pack years: 0.00   Types: Cigarettes  Smokeless Tobacco Never  Tobacco Comments   occ vapes    OBJECTIVE:  Vitals:   10/13/20 1139  BP: 139/88  Pulse: 66  Resp: 18  Temp: 98 F (36.7 C)  TempSrc: Oral  SpO2: 96%    General appearance: alert; no distress Eyes: PERRLA; EOMI; conjunctiva normal HENT: [place]; AT; with mild nasal congestion; TMs slightly bulging secondary to clear fluid Neck: supple  Lungs: speaks full sentences without difficulty; unlabored; bilateral exp wheezes Extremities: no edema Skin: warm and dry Neurologic: normal gait Psychological: alert and cooperative; normal mood and affect   Allergies  Allergen Reactions   Penicillins Shortness Of Breath, Nausea And Vomiting and Rash    Did it involve swelling of the face/tongue/throat, SOB, or low BP? Yes Did it involve  sudden or severe rash/hives, skin peeling, or any reaction on the inside of your mouth or nose? Yes Did you need to seek medical attention at a hospital or doctor's office? Yes When did it last happen?      childhood allergy If all above answers are "NO", may proceed with cephalosporin use.     Past Medical History:  Diagnosis Date   Anxiety    DDD (degenerative disc disease)    saw Dr. Ace Gins   Hypertension    Smoking    Social History   Socioeconomic History   Marital status: Married    Spouse name: Not on file   Number of children: Not on file   Years of education: Not on file   Highest education level: Not on file  Occupational History   Occupation: welder  Tobacco Use   Smoking status: Every Day    Packs/day: 1.00    Pack years: 0.00    Types: Cigarettes   Smokeless tobacco: Never   Tobacco comments:    occ vapes  Substance and Sexual Activity   Alcohol use: No   Drug use: No   Sexual activity: Yes    Birth control/protection: None  Other Topics Concern   Not on file  Social History Narrative   Married since 2014.Unemployed.Lives with wife and son.   Social Determinants of Health   Financial Resource Strain: Not on file  Food Insecurity: Not on file  Transportation Needs:  Not on file  Physical Activity: Not on file  Stress: Not on file  Social Connections: Not on file  Intimate Partner Violence: Not on file   Family History  Problem Relation Age of Onset   Arthritis Father    Hypertension Father    Hyperlipidemia Father    Asthma Son    COPD Maternal Grandmother    Colon cancer Maternal Grandfather    Heart disease Paternal Grandfather    Colon cancer Maternal Aunt    Breast cancer Maternal Aunt    Past Surgical History:  Procedure Laterality Date   COLONOSCOPY N/A 05/20/2019   Procedure: COLONOSCOPY;  Surgeon: Daneil Dolin, MD;  [facility];  Service: Endoscopy;  Laterality: N/A;  1:30pm   NO PAST SURGERIES     POLYPECTOMY   05/20/2019   Procedure: POLYPECTOMY;  Surgeon: Daneil Dolin, MD;  [facility];  Service: Endoscopy;;  hepatic flexure     Vanessa Kick, MD 10/13/20 [place], MD 10/13/20 1506

## 2020-10-28 ENCOUNTER — Telehealth (INDEPENDENT_AMBULATORY_CARE_PROVIDER_SITE_OTHER): Payer: Self-pay

## 2020-10-28 DIAGNOSIS — I10 Essential (primary) hypertension: Secondary | ICD-10-CM

## 2020-10-28 MED ORDER — LOSARTAN POTASSIUM 50 MG PO TABS: 50.0000 mg | ORAL_TABLET | Freq: Every day | ORAL | 0 refills | Status: DC

## 2020-10-28 NOTE — Telephone Encounter (Signed)
Received a refill request from [facility] in [place] for the following medication:  losartan (COZAAR) 50 MG tablet  Last filled 06/23/2020, # 30 with 3 refills  Last OV 08/23/2020  Next OV 12/02/2020

## 2020-11-07 DIAGNOSIS — H5213 Myopia, bilateral: Secondary | ICD-10-CM | POA: Diagnosis not present

## 2020-12-02 ENCOUNTER — Encounter (INDEPENDENT_AMBULATORY_CARE_PROVIDER_SITE_OTHER): Payer: BC Managed Care – PPO | Admitting: Nurse Practitioner

## 2020-12-09 ENCOUNTER — Encounter (INDEPENDENT_AMBULATORY_CARE_PROVIDER_SITE_OTHER): Payer: Medicaid Other | Admitting: Nurse Practitioner

## 2021-02-12 ENCOUNTER — Ambulatory Visit
Admission: EM | Admit: 2021-02-12 | Discharge: 2021-02-12 | Disposition: A | Discharge status: Home / Self Care | Payer: Medicaid Other | Attending: Urgent Care | Admitting: Urgent Care

## 2021-02-12 ENCOUNTER — Other Ambulatory Visit: Payer: Self-pay

## 2021-02-12 DIAGNOSIS — Z760 Encounter for issue of repeat prescription: Secondary | ICD-10-CM

## 2021-02-12 DIAGNOSIS — I10 Essential (primary) hypertension: Secondary | ICD-10-CM

## 2021-02-12 MED ORDER — LOSARTAN POTASSIUM 50 MG PO TABS: 50.0000 mg | ORAL_TABLET | Freq: Every day | ORAL | 0 refills | Status: DC

## 2021-02-12 NOTE — ED Triage Notes (Signed)
Pt needs refill for bp med until can be seen by new pcp

## 2021-02-12 NOTE — ED Provider Notes (Signed)
[facility]   MRN: [id number] DOB: [birth year]  Subjective:   Curtis Snow is a 45 y.o. male presenting for blood pressure medication refill.  Patient takes losartan once daily.  No headaches, chest pain, diaphoresis, abdominal pain, hematuria.  Patient reports that his PCP recently passed and has an set up for establishing care with a new one in January.  He is currently out of his blood pressure medications.  No current facility-administered medications for this encounter.  Current Outpatient Medications:    losartan (COZAAR) 50 MG tablet, Take 1 tablet (50 mg total) by mouth daily., Disp: 90 tablet, Rfl: 0   predniSONE (DELTASONE) 20 MG tablet, Take 2 tablets (40 mg total) by mouth daily., Disp: 10 tablet, Rfl: 0   Allergies  Allergen Reactions   Penicillins Shortness Of Breath, Nausea And Vomiting and Rash    Did it involve swelling of the face/tongue/throat, SOB, or low BP? Yes Did it involve sudden or severe rash/hives, skin peeling, or any reaction on the inside of your mouth or nose? Yes Did you need to seek medical attention at a hospital or doctor's office? Yes When did it last happen?      childhood allergy If all above answers are "NO", may proceed with cephalosporin use.     Past Medical History:  Diagnosis Date   Anxiety    DDD (degenerative disc disease)    saw Dr. Ace Gins   Hypertension    Smoking      Past Surgical History:  Procedure Laterality Date   COLONOSCOPY N/A 05/20/2019   Procedure: COLONOSCOPY;  Surgeon: Daneil Dolin, MD;  [facility];  Service: Endoscopy;  Laterality: N/A;  1:30pm   NO PAST SURGERIES     POLYPECTOMY  05/20/2019   Procedure: POLYPECTOMY;  Surgeon: Daneil Dolin, MD;  [facility];  Service: Endoscopy;;  hepatic flexure    Family History  Problem Relation Age of Onset   Arthritis Father    Hypertension Father    Hyperlipidemia Father    Asthma Son    COPD Maternal Grandmother     Colon cancer Maternal Grandfather    Heart disease Paternal Grandfather    Colon cancer Maternal Aunt    Breast cancer Maternal Aunt     Social History   Tobacco Use   Smoking status: Every Day    Packs/day: 1.00    Types: Cigarettes   Smokeless tobacco: Never   Tobacco comments:    occ vapes  Substance Use Topics   Alcohol use: No   Drug use: No    ROS   Objective:   Vitals: BP (!) 142/89   Pulse 90   Temp 97.9 F (36.6 C)   Resp 20   SpO2 95%   Physical Exam Constitutional:      General: He is not in acute distress.    Appearance: Normal appearance. He is well-developed and normal weight. He is not ill-appearing, toxic-appearing or diaphoretic.  HENT:     Head: Normocephalic and atraumatic.     Right Ear: External ear normal.     Left Ear: External ear normal.     Nose: Nose normal.     Mouth/Throat:     Pharynx: Oropharynx is clear.  Eyes:     General: No scleral icterus.       Right eye: No discharge.        Left eye: No discharge.     Extraocular Movements: Extraocular movements intact.  Pupils: Pupils are equal, round, and reactive to light.  Cardiovascular:     Rate and Rhythm: Normal rate.  Pulmonary:     Effort: Pulmonary effort is normal.  Musculoskeletal:     Cervical back: Normal range of motion.  Neurological:     Mental Status: He is alert and oriented to person, place, and time.  Psychiatric:        Mood and Affect: Mood normal.        Behavior: Behavior normal.        Thought Content: Thought content normal.        Judgment: Judgment normal.     Assessment and Plan :   PDMP not reviewed this encounter.  1. Essential hypertension   2. Encounter for medication refill    Courtesy refill provided to bridge his appointment with his new PCP. Counseled patient on potential for adverse effects with medications prescribed/recommended today, ER and return-to-clinic precautions discussed, patient verbalized understanding.    Jaynee Eagles, PA-C 02/12/21 1230

## 2021-03-01 ENCOUNTER — Ambulatory Visit
Admission: EM | Admit: 2021-03-01 | Discharge: 2021-03-01 | Disposition: A | Discharge status: Home / Self Care | Payer: Medicaid Other

## 2021-03-01 DIAGNOSIS — Z20822 Contact with and (suspected) exposure to covid-19: Secondary | ICD-10-CM | POA: Diagnosis not present

## 2021-03-01 DIAGNOSIS — R070 Pain in throat: Secondary | ICD-10-CM | POA: Diagnosis not present

## 2021-03-01 DIAGNOSIS — M791 Myalgia, unspecified site: Secondary | ICD-10-CM | POA: Diagnosis not present

## 2021-03-03 DIAGNOSIS — H6691 Otitis media, unspecified, right ear: Secondary | ICD-10-CM | POA: Diagnosis not present

## 2021-03-03 DIAGNOSIS — J069 Acute upper respiratory infection, unspecified: Secondary | ICD-10-CM | POA: Diagnosis not present

## 2021-03-03 DIAGNOSIS — J9801 Acute bronchospasm: Secondary | ICD-10-CM | POA: Diagnosis not present

## 2021-05-06 ENCOUNTER — Other Ambulatory Visit: Payer: Self-pay | Admitting: Family Medicine

## 2021-05-06 ENCOUNTER — Encounter: Payer: Self-pay | Admitting: Family Medicine

## 2021-05-06 ENCOUNTER — Ambulatory Visit: Payer: Medicaid Other | Admitting: Family Medicine

## 2021-05-06 VITALS — BP 138/80 | HR 88 | Temp 98.3°F | Resp 17 | Ht 74.0 in | Wt 180.8 lb

## 2021-05-06 DIAGNOSIS — R350 Frequency of micturition: Secondary | ICD-10-CM

## 2021-05-06 DIAGNOSIS — R002 Palpitations: Secondary | ICD-10-CM

## 2021-05-06 DIAGNOSIS — F17210 Nicotine dependence, cigarettes, uncomplicated: Secondary | ICD-10-CM | POA: Diagnosis not present

## 2021-05-06 DIAGNOSIS — Z13220 Encounter for screening for lipoid disorders: Secondary | ICD-10-CM

## 2021-05-06 DIAGNOSIS — I10 Essential (primary) hypertension: Secondary | ICD-10-CM

## 2021-05-06 LAB — POCT URINALYSIS DIP (MANUAL ENTRY)
Bilirubin, UA: NEGATIVE
Glucose, UA: NEGATIVE mg/dL
Ketones, POC UA: NEGATIVE mg/dL
Leukocytes, UA: NEGATIVE
Nitrite, UA: NEGATIVE
Protein Ur, POC: NEGATIVE mg/dL
Spec Grav, UA: 1.02 (ref 1.010–1.025)
Urobilinogen, UA: 0.2 E.U./dL
pH, UA: 6 (ref 5.0–8.0)

## 2021-05-06 NOTE — Patient Instructions (Addendum)
Keep up the good work with quitting smoking. Let me know if you would like to try Chantix.   Keep a record of your blood pressures outside of the office and if above 140/90 - we need to increase meds. Let me know.   See information below on palpitations.  I will check some blood work but if the symptoms continue I would recommend follow-up with cardiology as heart monitor may be recommended.Return to the clinic or go to the nearest emergency room if any of your symptoms worsen or new symptoms occur.  I will check some urine test, prostate test and let you know if there are concerns or possible need for antibiotics.  Thanks for coming in today.   Palpitations Palpitations are feelings that your heartbeat is irregular or is faster than normal. It may feel like your heart is fluttering or skipping a beat. Palpitations may be caused by many things, including smoking, caffeine, alcohol, stress, and certain medicines or drugs. Most causes of palpitations are not serious.  However, some palpitations can be a sign of a serious problem. Further tests and a thorough medical history will be done to find the cause of your palpitations. Your provider may order tests such as an ECG, labs, an echocardiogram, or an ambulatory continuous ECG monitor. Follow these instructions at home: Pay attention to any changes in your symptoms. Let your health care provider know about them. Take these actions to help manage your symptoms: Eating and drinking Follow instructions from your health care provider about eating or drinking restrictions. You may need to avoid foods and drinks that may cause palpitations. These may include: Caffeinated coffee, tea, soft drinks, and energy drinks. Chocolate. Alcohol. Diet pills. Lifestyle   Take steps to reduce your stress and anxiety. Things that can help you relax include: Yoga. Mind-body activities, such as deep breathing, meditation, or using words and images to create  positive thoughts (guided imagery). Physical activity, such as swimming, jogging, or walking. Tell your health care provider if your palpitations increase with activity. If you have chest pain or shortness of breath with activity, do not continue the activity until you are seen by your health care provider. Biofeedback. This is a method that helps you learn to use your mind to control things in your body, such as your heartbeat. Get plenty of rest and sleep. Keep a regular bed time. Do not use drugs, including cocaine or ecstasy. Do not use marijuana. Do not use any products that contain nicotine or tobacco. These products include cigarettes, chewing tobacco, and vaping devices, such as e-cigarettes. If you need help quitting, ask your health care provider. General instructions Take over-the-counter and prescription medicines only as told by your health care provider. Keep all follow-up visits. This is important. These may include visits for further testing if palpitations do not go away or get worse. Contact a health care provider if: You continue to have a fast or irregular heartbeat for a long period of time. You notice that your palpitations occur more often. Get help right away if: You have chest pain or shortness of breath. You have a severe headache. You feel dizzy or you faint. These symptoms may represent a serious problem that is an emergency. Do not wait to see if the symptoms will go away. Get medical help right away. Call your local emergency services (911 in the [place] Do not drive yourself to the hospital. Summary Palpitations are feelings that your heartbeat is irregular or is faster than  normal. It may feel like your heart is fluttering or skipping a beat. Palpitations may be caused by many things, including smoking, caffeine, alcohol, stress, certain medicines, and drugs. Further tests and a thorough medical history may be done to find the cause of your palpitations. Get help  right away if you faint or have chest pain, shortness of breath, severe headache, or dizziness. This information is not intended to replace advice given to you by your health care provider. Make sure you discuss any questions you have with your health care provider. Document Revised: 09/08/2020 Document Reviewed: 09/08/2020 Elsevier Patient Education  2022 [place] Your Hypertension Hypertension, also called high blood pressure, is when the force of the blood pressing against the walls of the arteries is too strong. Arteries are blood vessels that carry blood from your heart throughout your body. Hypertension forces the heart to work harder to pump blood and may cause the arteries to become narrow or stiff. Understanding blood pressure readings Your personal target blood pressure may vary depending on your medical conditions, your age, and other factors. A blood pressure reading includes a higher number over a lower number. Ideally, your blood pressure should be below 120/80. You should know that: The first, or top, number is called the systolic pressure. It is a measure of the pressure in your arteries as your heart beats. The second, or bottom number, is called the diastolic pressure. It is a measure of the pressure in your arteries as the heart relaxes. Blood pressure is classified into four stages. Based on your blood pressure reading, your health care provider may use the following stages to determine what type of treatment you need, if any. Systolic pressure and diastolic pressure are measured in a unit called mmHg. Normal Systolic pressure: below 638. Diastolic pressure: below 80. Elevated Systolic pressure: 466-599. Diastolic pressure: below 80. Hypertension stage 1 Systolic pressure: 357-017. Diastolic pressure: 79-39. Hypertension stage 2 Systolic pressure: 030 or above. Diastolic pressure: 90 or above. How can this condition affect me? Managing your  hypertension is an important responsibility. Over time, hypertension can damage the arteries and decrease blood flow to important parts of the body, including the brain, heart, and kidneys. Having untreated or uncontrolled hypertension can lead to: A heart attack. A stroke. A weakened blood vessel (aneurysm). Heart failure. Kidney damage. Eye damage. Metabolic syndrome. Memory and concentration problems. Vascular dementia. What actions can I take to manage this condition? Hypertension can be managed by making lifestyle changes and possibly by taking medicines. Your health care provider will help you make a plan to bring your blood pressure within a normal range. Nutrition  Eat a diet that is high in fiber and potassium, and low in salt (sodium), added sugar, and fat. An example eating plan is called the Dietary Approaches to Stop Hypertension (DASH) diet. To eat this way: Eat plenty of fresh fruits and vegetables. Try to fill one-half of your plate at each meal with fruits and vegetables. Eat whole grains, such as whole-wheat pasta, brown rice, or whole-grain bread. Fill about one-fourth of your plate with whole grains. Eat low-fat dairy products. Avoid fatty cuts of meat, processed or cured meats, and poultry with skin. Fill about one-fourth of your plate with lean proteins such as fish, chicken without skin, beans, eggs, and tofu. Avoid pre-made and processed foods. These tend to be higher in sodium, added sugar, and fat. Reduce your daily sodium intake. Most people with hypertension should eat less  than 1,500 mg of sodium a day. Lifestyle  Work with your health care provider to maintain a healthy body weight or to lose weight. Ask what an ideal weight is for you. Get at least 30 minutes of exercise that causes your heart to beat faster (aerobic exercise) most days of the week. Activities may include walking, swimming, or biking. Include exercise to strengthen your muscles (resistance  exercise), such as weight lifting, as part of your weekly exercise routine. Try to do these types of exercises for 30 minutes at least 3 days a week. Do not use any products that contain nicotine or tobacco, such as cigarettes, e-cigarettes, and chewing tobacco. If you need help quitting, ask your health care provider. Control any long-term (chronic) conditions you have, such as high cholesterol or diabetes. Identify your sources of stress and find ways to manage stress. This may include meditation, deep breathing, or making time for fun activities. Alcohol use Do not drink alcohol if: Your health care provider tells you not to drink. You are pregnant, may be pregnant, or are planning to become pregnant. If you drink alcohol: Limit how much you use to: 0-1 drink a day for women. 0-2 drinks a day for men. Be aware of how much alcohol is in your drink. In the [place] one drink equals one 12 oz bottle of beer (355 mL), one 5 oz glass of wine (148 mL), or one 1 oz glass of hard liquor (44 mL). Medicines Your health care provider may prescribe medicine if lifestyle changes are not enough to get your blood pressure under control and if: Your systolic blood pressure is 130 or higher. Your diastolic blood pressure is 80 or higher. Take medicines only as told by your health care provider. Follow the directions carefully. Blood pressure medicines must be taken as told by your health care provider. The medicine does not work as well when you skip doses. Skipping doses also puts you at risk for problems. Monitoring Before you monitor your blood pressure: Do not smoke, drink caffeinated beverages, or exercise within 30 minutes before taking a measurement. Use the bathroom and empty your bladder (urinate). Sit quietly for at least 5 minutes before taking measurements. Monitor your blood pressure at home as told by your health care provider. To do this: Sit with your back straight and supported. Place your  feet flat on the floor. Do not cross your legs. Support your arm on a flat surface, such as a table. Make sure your upper arm is at heart level. Each time you measure, take two or three readings one minute apart and record the results. You may also need to have your blood pressure checked regularly by your health care provider. General information Talk with your health care provider about your diet, exercise habits, and other lifestyle factors that may be contributing to hypertension. Review all the medicines you take with your health care provider because there may be side effects or interactions. Keep all visits as told by your health care provider. Your health care provider can help you create and adjust your plan for managing your high blood pressure. Where to find more information National Heart, Lung, and Blood Institute: https://wilson-eaton.com/ American Heart Association: www.heart.org Contact a health care provider if: You think you are having a reaction to medicines you have taken. You have repeated (recurrent) headaches. You feel dizzy. You have swelling in your ankles. You have trouble with your vision. Get help right away if: You develop a severe headache  or confusion. You have unusual weakness or numbness, or you feel faint. You have severe pain in your chest or abdomen. You vomit repeatedly. You have trouble breathing. These symptoms may represent a serious problem that is an emergency. Do not wait to see if the symptoms will go away. Get medical help right away. Call your local emergency services (911 in the [place] Do not drive yourself to the hospital. Summary Hypertension is when the force of blood pumping through your arteries is too strong. If this condition is not controlled, it may put you at risk for serious complications. Your personal target blood pressure may vary depending on your medical conditions, your age, and other factors. For most people, a normal blood pressure  is less than 120/80. Hypertension is managed by lifestyle changes, medicines, or both. Lifestyle changes to help manage hypertension include losing weight, eating a healthy, low-sodium diet, exercising more, stopping smoking, and limiting alcohol. This information is not intended to replace advice given to you by your health care provider. Make sure you discuss any questions you have with your health care provider. Document Revised: 05/05/2019 Document Reviewed: 03/18/2019 Elsevier Patient Education  2022 [place] of Quitting Smoking Quitting smoking is a physical and mental challenge. You will face cravings, withdrawal symptoms, and temptation. Before quitting, work with your health care provider to make a plan that can help you manage quitting. Preparation can help you quit and keep you from giving in. How to manage lifestyle changes Managing stress Stress can make you want to smoke, and wanting to smoke may cause stress. It is important to find ways to manage your stress. You might try some of the following: Practice relaxation techniques. Breathe slowly and deeply, in through your nose and out through your mouth. Listen to music. Soak in a bath or take a shower. Imagine a peaceful place or vacation. Get some support. Talk with family or friends about your stress. Join a support group. Talk with a counselor or therapist. Get some physical activity. Go for a walk, run, or bike ride. Play a favorite sport. Practice yoga.  Medicines Talk with your health care provider about medicines that might help you deal with cravings and make quitting easier for you. Relationships Social situations can be difficult when you are quitting smoking. To manage this, you can: Avoid parties and other social situations where people might be smoking. Avoid alcohol. Leave right away if you have the urge to smoke. Explain to your family and friends that you are quitting  smoking. Ask for support and let them know you might be a bit grumpy. Plan activities where smoking is not an option. General instructions Be aware that many people gain weight after they quit smoking. However, not everyone does. To keep from gaining weight, have a plan in place before you quit and stick to the plan after you quit. Your plan should include: Having healthy snacks. When you have a craving, it may help to: Eat popcorn, carrots, celery, or other cut vegetables. Chew sugar-free gum. Changing how you eat. Eat small portion sizes at meals. Eat 4-6 small meals throughout the day instead of 1-2 large meals a day. Be mindful when you eat. Do not watch television or do other things that might distract you as you eat. Exercising regularly. Make time to exercise each day. If you do not have time for a long workout, do short bouts of exercise for 5-10 minutes several times a day. Do  some form of strengthening exercise, such as weight lifting. Do some exercise that gets your heart beating and causes you to breathe deeply, such as walking fast, running, swimming, or biking. This is very important. Drinking plenty of water or other low-calorie or no-calorie drinks. Drink 6-8 glasses of water daily.  How to recognize withdrawal symptoms Your body and mind may experience discomfort as you try to get used to not having nicotine in your system. These effects are called withdrawal symptoms. They may include: Feeling hungrier than normal. Having trouble concentrating. Feeling irritable or restless. Having trouble sleeping. Feeling depressed. Craving a cigarette. To manage withdrawal symptoms: Avoid places, people, and activities that trigger your cravings. Remember why you want to quit. Get plenty of sleep. Avoid coffee and other caffeinated drinks. These may worsen some of your symptoms. These symptoms may surprise you. But be assured that they are normal to have when quitting smoking. How  to manage cravings Come up with a plan for how to deal with your cravings. The plan should include the following: A definition of the specific situation you want to deal with. An alternative action you will take. A clear idea for how this action will help. The name of someone who might help you with this. Cravings usually last for 5-10 minutes. Consider taking the following actions to help you with your plan to deal with cravings: Keep your mouth busy. Chew sugar-free gum. Suck on hard candies or a straw. Brush your teeth. Keep your hands and body busy. Change to a different activity right away. Squeeze or play with a ball. Do an activity or a hobby, such as making bead jewelry, practicing needlepoint, or working with wood. Mix up your normal routine. Take a short exercise break. Go for a quick walk or run up and down stairs. Focus on doing something kind or helpful for someone else. Call a friend or family member to talk during a craving. Join a support group. Contact a quitline. Where to find support To get help or find a support group: Call the [facility]'s Smoking Quitline: 1-800-QUIT NOW 580-122-6047) Visit the website of the Substance Abuse and [place]: ktimeonline.com Text QUIT to SmokefreeTXT: 034742 Where to find more information Visit these websites to find more information on quitting smoking: [place]: www.smokefree.gov American Lung Association: www.lung.org American Cancer Society: www.cancer.org Centers for Disease Control and Prevention: http://www.wolf.info/ American Heart Association: www.heart.org Contact a health care provider if: You want to change your plan for quitting. The medicines you are taking are not helping. Your eating feels out of control or you cannot sleep. Get help right away if: You feel depressed or become very anxious. Summary Quitting smoking is a physical and mental challenge. You will face  cravings, withdrawal symptoms, and temptation to smoke again. Preparation can help you as you go through these challenges. Try different techniques to manage stress, handle social situations, and prevent weight gain. You can deal with cravings by keeping your mouth busy (such as by chewing gum), keeping your hands and body busy, calling family or friends, or contacting a quitline for people who want to quit smoking. You can deal with withdrawal symptoms by avoiding places where people smoke, getting plenty of rest, and avoiding drinks with caffeine. This information is not intended to replace advice given to you by your health care provider. Make sure you discuss any questions you have with your health care provider. Document Revised: 12/24/2020 Document Reviewed: 02/04/2019 Elsevier Patient  Education  2022 [employer].

## 2021-05-06 NOTE — Addendum Note (Signed)
Addended by: Merri Ray R on: 05/06/2021 02:06 PM   Modules accepted: Orders

## 2021-05-06 NOTE — Progress Notes (Signed)
Subjective:  Patient ID: Curtis Snow, male    DOB: 1975/11/14  Age: 46 y.o. MRN: [id number]  CC:  Chief Complaint  Patient presents with   New Patient (Initial Visit)    Pt was [facility]ast[facility] Champion pt, looking for new PCP, does need some refills on medication     HPI Curtis Snow presents for  New patient establish care.  Previous primary provider Dr. Anastasio Champion deceased.  Most recent visit had been in April 2022. I am also taking care of his father as a recent new patient.  Wife will be establishing with me as well.  Married. 6yo son - plumber, 15yo son - [employer].    Hypertension: Treated with losartan 50 mg daily.  Meds refilled at urgent care in October. No new side effects.  Home readings: rare - up to 150/95, usually 140/85-90.  Some palpitations at night -after eating certain foods, fatty foods. Heart beating harder. 2 times per week. No associated diaphoresis, CP, lightheadedness.  No CP/DOE. No daytime symptoms.  FH: father with heart arythmia, FH CAD in paternal GF at 46yo.  Referred to cardiology in 2021 - was not seen. Busy schedule.  BP Readings from Last 3 Encounters:  05/06/21 138/80  02/12/21 (!) 142/89  10/13/20 139/88   Lab Results  Component Value Date   CREATININE 0.92 08/23/2020   Nicotine addiction: Quit for 2 years, but vaping, then restarted smoking 1 year ago - 2 ppd, now down to 1 ppd.  Wife quit 2 weeks ago.  Wellbutrin about 10 yrs ago - weird dream - stopped med in few days.  Plans to increase exercise, but difficult with workload.  [employer]elder, displays for trade shows.  Urinary frequency Past week or two, increased frequency, urgency.  Slight discomfort with use of seatbelt across lap.  No hematuria. No nocturia. No new back pain, n/v/fever.  No testicular pain.  Prior hx of nephrolithiasis - few years ago. Benign kidney cysts. No penile discharge, no recent intercourse. No new sexual contacts.  Tx: none.    History Patient Active  Problem List   Diagnosis Date Noted   HTN (hypertension) 11/18/2019   Rectal bleeding 05/13/2019   Smoking    Past Medical History:  Diagnosis Date   Anxiety    DDD (degenerative disc disease)    saw Dr. Ace Gins   Hypertension    Smoking    Past Surgical History:  Procedure Laterality Date   COLONOSCOPY N/A 05/20/2019   Procedure: COLONOSCOPY;  Surgeon: Daneil Dolin, MD;  Location: [facility];  Service: Endoscopy;  Laterality: N/A;  1:30pm   NO PAST SURGERIES     POLYPECTOMY  05/20/2019   Procedure: POLYPECTOMY;  Surgeon: Daneil Dolin, MD;  Location: [facility];  Service: Endoscopy;;  hepatic flexure   Allergies  Allergen Reactions   Penicillins Shortness Of Breath, Nausea And Vomiting and Rash    Did it involve swelling of the face/tongue/throat, SOB, or low BP? Yes Did it involve sudden or severe rash/hives, skin peeling, or any reaction on the inside of your mouth or nose? Yes Did you need to seek medical attention at a hospital or doctor's office? Yes When did it last happen?      childhood allergy If all above answers are NO, may proceed with cephalosporin use.    Prior to Admission medications   Medication Sig Start Date End Date Taking? Authorizing Provider  losartan (COZAAR) 50 MG tablet Take 1 tablet (50 mg total)  by mouth daily. 02/12/21   Jaynee Eagles, PA-C  predniSONE (DELTASONE) 20 MG tablet Take 2 tablets (40 mg total) by mouth daily. 10/13/20   Vanessa Kick, MD   Social History   Socioeconomic History   Marital status: Married    Spouse name: Not on file   Number of children: Not on file   Years of education: Not on file   Highest education level: Not on file  Occupational History   Occupation: welder  Tobacco Use   Smoking status: Every Day    Packs/day: 1.00    Types: Cigarettes   Smokeless tobacco: Never   Tobacco comments:    occ vapes  Substance and Sexual Activity   Alcohol use: No    Comment: rare   Drug use: No   Sexual  activity: Yes    Birth control/protection: None  Other Topics Concern   Not on file  Social History Narrative   Married since 2014.Unemployed.Lives with wife and son.   Social Determinants of Health   Financial Resource Strain: Not on file  Food Insecurity: Not on file  Transportation Needs: Not on file  Physical Activity: Not on file  Stress: Not on file  Social Connections: Not on file  Intimate Partner Violence: Not on file    Review of Systems Per HPI  Objective:   Vitals:   05/06/21 0918  BP: 138/80  Pulse: 88  Resp: 17  Temp: 98.3 F (36.8 C)  TempSrc: Temporal  SpO2: 98%  Weight: 180 lb 12.8 oz (82 kg)  Height: 6\' 2"  (1.88 m)     Physical Exam Vitals reviewed.  Constitutional:      Appearance: He is well-developed.  HENT:     Head: Normocephalic and atraumatic.  Neck:     Vascular: No carotid bruit or JVD.  Cardiovascular:     Rate and Rhythm: Normal rate and regular rhythm.     Heart sounds: Normal heart sounds. No murmur heard. Pulmonary:     Effort: Pulmonary effort is normal.     Breath sounds: Normal breath sounds. No rales.  Musculoskeletal:     Right lower leg: No edema.     Left lower leg: No edema.  Skin:    General: Skin is warm and dry.  Neurological:     Mental Status: He is alert and oriented to person, place, and time.  Psychiatric:        Mood and Affect: Mood normal.   EKG: Sinus rhythm, no acute findings or significant changes seen from 05/08/2019 EKG.  Results for orders placed or performed in visit on 05/06/21  POCT urinalysis dipstick  Result Value Ref Range   Color, UA yellow yellow   Clarity, UA clear clear   Glucose, UA negative negative mg/dL   Bilirubin, UA negative negative   Ketones, POC UA negative negative mg/dL   Spec Grav, UA 1.020 1.010 - 1.025   Blood, UA small (A) negative   pH, UA 6.0 5.0 - 8.0   Protein Ur, POC negative negative mg/dL   Urobilinogen, UA 0.2 0.2 or 1.0 E.U./dL   Nitrite, UA Negative  Negative   Leukocytes, UA Negative Negative     Assessment & Plan:  Curtis Snow is a 46 y.o. male . Essential hypertension - Plan: Comprehensive metabolic panel, Lipid panel  -Borderline elevated on current regimen.  Option of higher dosing of losartan versus diet changes, also plans to cut back further on smoking.  Handout given on managing hypertension  with RTC precautions.  Recheck 1 month  Palpitations - Plan: Comprehensive metabolic panel, TSH, CBC, EKG 12-Lead  -Nighttime symptoms as above, food related?  EKG without acute findings.  Check TSH, CBC, recheck 1 month but consider cardiology eval with possible Zio patch/outpatient monitoring.  Urinary frequency - Plan: POCT urinalysis dipstick, PSA, Urine Culture  -In office urinalysis reassuring.  Check PSA, urine culture.  No pain to suggest kidney stone as cause although small hematuria on urinalysis.  Cigarette nicotine dependence without complication  -Commended on efforts of cessation, option of Chantix including discussion of risks and benefits.  Can prescribe without office visit if he  would like.  Handout given.  Screening for hyperlipidemia - Plan: Lipid panel   No orders of the defined types were placed in this encounter.  Patient Instructions   Keep up the good work with quitting smoking. Let me know if you would like to try Chantix.   Keep a record of your blood pressures outside of the office and if above 140/90 - we need to increase meds. Let me know.   See information below on palpitations.  I will check some blood work but if the symptoms continue I would recommend follow-up with cardiology as heart monitor may be recommended.Return to the clinic or go to the nearest emergency room if any of your symptoms worsen or new symptoms occur.  I will check some urine test, prostate test and let you know if there are concerns or possible need for antibiotics.  Thanks for coming in  today.   Palpitations Palpitations are feelings that your heartbeat is irregular or is faster than normal. It may feel like your heart is fluttering or skipping a beat. Palpitations may be caused by many things, including smoking, caffeine, alcohol, stress, and certain medicines or drugs. Most causes of palpitations are not serious.  However, some palpitations can be a sign of a serious problem. Further tests and a thorough medical history will be done to find the cause of your palpitations. Your provider may order tests such as an ECG, labs, an echocardiogram, or an ambulatory continuous ECG monitor. Follow these instructions at home: Pay attention to any changes in your symptoms. Let your health care provider know about them. Take these actions to help manage your symptoms: Eating and drinking Follow instructions from your health care provider about eating or drinking restrictions. You may need to avoid foods and drinks that may cause palpitations. These may include: Caffeinated coffee, tea, soft drinks, and energy drinks. Chocolate. Alcohol. Diet pills. Lifestyle   Take steps to reduce your stress and anxiety. Things that can help you relax include: Yoga. Mind-body activities, such as deep breathing, meditation, or using words and images to create positive thoughts (guided imagery). Physical activity, such as swimming, jogging, or walking. Tell your health care provider if your palpitations increase with activity. If you have chest pain or shortness of breath with activity, do not continue the activity until you are seen by your health care provider. Biofeedback. This is a method that helps you learn to use your mind to control things in your body, such as your heartbeat. Get plenty of rest and sleep. Keep a regular bed time. Do not use drugs, including cocaine or ecstasy. Do not use marijuana. Do not use any products that contain nicotine or tobacco. These products include cigarettes,  chewing tobacco, and vaping devices, such as e-cigarettes. If you need help quitting, ask your health care provider. General instructions Take over-the-counter  and prescription medicines only as told by your health care provider. Keep all follow-up visits. This is important. These may include visits for further testing if palpitations do not go away or get worse. Contact a health care provider if: You continue to have a fast or irregular heartbeat for a long period of time. You notice that your palpitations occur more often. Get help right away if: You have chest pain or shortness of breath. You have a severe headache. You feel dizzy or you faint. These symptoms may represent a serious problem that is an emergency. Do not wait to see if the symptoms will go away. Get medical help right away. Call your local emergency services (911 in the [place] Do not drive yourself to the hospital. Summary Palpitations are feelings that your heartbeat is irregular or is faster than normal. It may feel like your heart is fluttering or skipping a beat. Palpitations may be caused by many things, including smoking, caffeine, alcohol, stress, certain medicines, and drugs. Further tests and a thorough medical history may be done to find the cause of your palpitations. Get help right away if you faint or have chest pain, shortness of breath, severe headache, or dizziness. This information is not intended to replace advice given to you by your health care provider. Make sure you discuss any questions you have with your health care provider. Document Revised: 09/08/2020 Document Reviewed: 09/08/2020 Elsevier Patient Education  2022 [place] Your Hypertension Hypertension, also called high blood pressure, is when the force of the blood pressing against the walls of the arteries is too strong. Arteries are blood vessels that carry blood from your heart throughout your body. Hypertension forces the  heart to work harder to pump blood and may cause the arteries to become narrow or stiff. Understanding blood pressure readings Your personal target blood pressure may vary depending on your medical conditions, your age, and other factors. A blood pressure reading includes a higher number over a lower number. Ideally, your blood pressure should be below 120/80. You should know that: The first, or top, number is called the systolic pressure. It is a measure of the pressure in your arteries as your heart beats. The second, or bottom number, is called the diastolic pressure. It is a measure of the pressure in your arteries as the heart relaxes. Blood pressure is classified into four stages. Based on your blood pressure reading, your health care provider may use the following stages to determine what type of treatment you need, if any. Systolic pressure and diastolic pressure are measured in a unit called mmHg. Normal Systolic pressure: below 277. Diastolic pressure: below 80. Elevated Systolic pressure: 412-878. Diastolic pressure: below 80. Hypertension stage 1 Systolic pressure: 676-720. Diastolic pressure: 94-70. Hypertension stage 2 Systolic pressure: 962 or above. Diastolic pressure: 90 or above. How can this condition affect me? Managing your hypertension is an important responsibility. Over time, hypertension can damage the arteries and decrease blood flow to important parts of the body, including the brain, heart, and kidneys. Having untreated or uncontrolled hypertension can lead to: A heart attack. A stroke. A weakened blood vessel (aneurysm). Heart failure. Kidney damage. Eye damage. Metabolic syndrome. Memory and concentration problems. Vascular dementia. What actions can I take to manage this condition? Hypertension can be managed by making lifestyle changes and possibly by taking medicines. Your health care provider will help you make a plan to bring your blood pressure within  a normal range. Nutrition  Eat a diet that is high in fiber and potassium, and low in salt (sodium), added sugar, and fat. An example eating plan is called the Dietary Approaches to Stop Hypertension (DASH) diet. To eat this way: Eat plenty of fresh fruits and vegetables. Try to fill one-half of your plate at each meal with fruits and vegetables. Eat whole grains, such as whole-wheat pasta, brown rice, or whole-grain bread. Fill about one-fourth of your plate with whole grains. Eat low-fat dairy products. Avoid fatty cuts of meat, processed or cured meats, and poultry with skin. Fill about one-fourth of your plate with lean proteins such as fish, chicken without skin, beans, eggs, and tofu. Avoid pre-made and processed foods. These tend to be higher in sodium, added sugar, and fat. Reduce your daily sodium intake. Most people with hypertension should eat less than 1,500 mg of sodium a day. Lifestyle  Work with your health care provider to maintain a healthy body weight or to lose weight. Ask what an ideal weight is for you. Get at least 30 minutes of exercise that causes your heart to beat faster (aerobic exercise) most days of the week. Activities may include walking, swimming, or biking. Include exercise to strengthen your muscles (resistance exercise), such as weight lifting, as part of your weekly exercise routine. Try to do these types of exercises for 30 minutes at least 3 days a week. Do not use any products that contain nicotine or tobacco, such as cigarettes, e-cigarettes, and chewing tobacco. If you need help quitting, ask your health care provider. Control any long-term (chronic) conditions you have, such as high cholesterol or diabetes. Identify your sources of stress and find ways to manage stress. This may include meditation, deep breathing, or making time for fun activities. Alcohol use Do not drink alcohol if: Your health care provider tells you not to drink. You are pregnant,  may be pregnant, or are planning to become pregnant. If you drink alcohol: Limit how much you use to: 0-1 drink a day for women. 0-2 drinks a day for men. Be aware of how much alcohol is in your drink. In the [place] one drink equals one 12 oz bottle of beer (355 mL), one 5 oz glass of wine (148 mL), or one 1 oz glass of hard liquor (44 mL). Medicines Your health care provider may prescribe medicine if lifestyle changes are not enough to get your blood pressure under control and if: Your systolic blood pressure is 130 or higher. Your diastolic blood pressure is 80 or higher. Take medicines only as told by your health care provider. Follow the directions carefully. Blood pressure medicines must be taken as told by your health care provider. The medicine does not work as well when you skip doses. Skipping doses also puts you at risk for problems. Monitoring Before you monitor your blood pressure: Do not smoke, drink caffeinated beverages, or exercise within 30 minutes before taking a measurement. Use the bathroom and empty your bladder (urinate). Sit quietly for at least 5 minutes before taking measurements. Monitor your blood pressure at home as told by your health care provider. To do this: Sit with your back straight and supported. Place your feet flat on the floor. Do not cross your legs. Support your arm on a flat surface, such as a table. Make sure your upper arm is at heart level. Each time you measure, take two or three readings one minute apart and record the results. You may also need to have your blood  pressure checked regularly by your health care provider. General information Talk with your health care provider about your diet, exercise habits, and other lifestyle factors that may be contributing to hypertension. Review all the medicines you take with your health care provider because there may be side effects or interactions. Keep all visits as told by your health care provider.  Your health care provider can help you create and adjust your plan for managing your high blood pressure. Where to find more information National Heart, Lung, and Blood Institute: https://wilson-eaton.com/ American Heart Association: www.heart.org Contact a health care provider if: You think you are having a reaction to medicines you have taken. You have repeated (recurrent) headaches. You feel dizzy. You have swelling in your ankles. You have trouble with your vision. Get help right away if: You develop a severe headache or confusion. You have unusual weakness or numbness, or you feel faint. You have severe pain in your chest or abdomen. You vomit repeatedly. You have trouble breathing. These symptoms may represent a serious problem that is an emergency. Do not wait to see if the symptoms will go away. Get medical help right away. Call your local emergency services (911 in the [place] Do not drive yourself to the hospital. Summary Hypertension is when the force of blood pumping through your arteries is too strong. If this condition is not controlled, it may put you at risk for serious complications. Your personal target blood pressure may vary depending on your medical conditions, your age, and other factors. For most people, a normal blood pressure is less than 120/80. Hypertension is managed by lifestyle changes, medicines, or both. Lifestyle changes to help manage hypertension include losing weight, eating a healthy, low-sodium diet, exercising more, stopping smoking, and limiting alcohol. This information is not intended to replace advice given to you by your health care provider. Make sure you discuss any questions you have with your health care provider. Document Revised: 05/05/2019 Document Reviewed: 03/18/2019 Elsevier Patient Education  2022 [place] of Quitting Smoking Quitting smoking is a physical and mental challenge. You will face cravings, withdrawal  symptoms, and temptation. Before quitting, work with your health care provider to make a plan that can help you manage quitting. Preparation can help you quit and keep you from giving in. How to manage lifestyle changes Managing stress Stress can make you want to smoke, and wanting to smoke may cause stress. It is important to find ways to manage your stress. You might try some of the following: Practice relaxation techniques. Breathe slowly and deeply, in through your nose and out through your mouth. Listen to music. Soak in a bath or take a shower. Imagine a peaceful place or vacation. Get some support. Talk with family or friends about your stress. Join a support group. Talk with a counselor or therapist. Get some physical activity. Go for a walk, run, or bike ride. Play a favorite sport. Practice yoga.  Medicines Talk with your health care provider about medicines that might help you deal with cravings and make quitting easier for you. Relationships Social situations can be difficult when you are quitting smoking. To manage this, you can: Avoid parties and other social situations where people might be smoking. Avoid alcohol. Leave right away if you have the urge to smoke. Explain to your family and friends that you are quitting smoking. Ask for support and let them know you might be a bit grumpy. Plan activities where smoking is not  an option. General instructions Be aware that many people gain weight after they quit smoking. However, not everyone does. To keep from gaining weight, have a plan in place before you quit and stick to the plan after you quit. Your plan should include: Having healthy snacks. When you have a craving, it may help to: Eat popcorn, carrots, celery, or other cut vegetables. Chew sugar-free gum. Changing how you eat. Eat small portion sizes at meals. Eat 4-6 small meals throughout the day instead of 1-2 large meals a day. Be mindful when you eat. Do not  watch television or do other things that might distract you as you eat. Exercising regularly. Make time to exercise each day. If you do not have time for a long workout, do short bouts of exercise for 5-10 minutes several times a day. Do some form of strengthening exercise, such as weight lifting. Do some exercise that gets your heart beating and causes you to breathe deeply, such as walking fast, running, swimming, or biking. This is very important. Drinking plenty of water or other low-calorie or no-calorie drinks. Drink 6-8 glasses of water daily.  How to recognize withdrawal symptoms Your body and mind may experience discomfort as you try to get used to not having nicotine in your system. These effects are called withdrawal symptoms. They may include: Feeling hungrier than normal. Having trouble concentrating. Feeling irritable or restless. Having trouble sleeping. Feeling depressed. Craving a cigarette. To manage withdrawal symptoms: Avoid places, people, and activities that trigger your cravings. Remember why you want to quit. Get plenty of sleep. Avoid coffee and other caffeinated drinks. These may worsen some of your symptoms. These symptoms may surprise you. But be assured that they are normal to have when quitting smoking. How to manage cravings Come up with a plan for how to deal with your cravings. The plan should include the following: A definition of the specific situation you want to deal with. An alternative action you will take. A clear idea for how this action will help. The name of someone who might help you with this. Cravings usually last for 5-10 minutes. Consider taking the following actions to help you with your plan to deal with cravings: Keep your mouth busy. Chew sugar-free gum. Suck on hard candies or a straw. Brush your teeth. Keep your hands and body busy. Change to a different activity right away. Squeeze or play with a ball. Do an activity or a  hobby, such as making bead jewelry, practicing needlepoint, or working with wood. Mix up your normal routine. Take a short exercise break. Go for a quick walk or run up and down stairs. Focus on doing something kind or helpful for someone else. Call a friend or family member to talk during a craving. Join a support group. Contact a quitline. Where to find support To get help or find a support group: Call the [facility]'s Smoking Quitline: 1-800-QUIT NOW 978-174-3307) Visit the website of the Substance Abuse and [place]: ktimeonline.com Text QUIT to SmokefreeTXT: 676195 Where to find more information Visit these websites to find more information on quitting smoking: [place]: www.smokefree.gov American Lung Association: www.lung.org American Cancer Society: www.cancer.org Centers for Disease Control and Prevention: http://www.wolf.info/ American Heart Association: www.heart.org Contact a health care provider if: You want to change your plan for quitting. The medicines you are taking are not helping. Your eating feels out of control or you cannot sleep. Get help right away if: You feel  depressed or become very anxious. Summary Quitting smoking is a physical and mental challenge. You will face cravings, withdrawal symptoms, and temptation to smoke again. Preparation can help you as you go through these challenges. Try different techniques to manage stress, handle social situations, and prevent weight gain. You can deal with cravings by keeping your mouth busy (such as by chewing gum), keeping your hands and body busy, calling family or friends, or contacting a quitline for people who want to quit smoking. You can deal with withdrawal symptoms by avoiding places where people smoke, getting plenty of rest, and avoiding drinks with caffeine. This information is not intended to replace advice given to you by your health care provider. Make sure  you discuss any questions you have with your health care provider. Document Revised: 12/24/2020 Document Reviewed: 02/04/2019 Elsevier Patient Education  2022 [place],   Merri Ray, MD [place], [facility] 05/06/21 10:20 AM

## 2021-05-08 ENCOUNTER — Emergency Department (HOSPITAL_COMMUNITY): Payer: Medicaid Other

## 2021-05-08 ENCOUNTER — Encounter (HOSPITAL_COMMUNITY): Payer: Self-pay | Admitting: *Deleted

## 2021-05-08 ENCOUNTER — Emergency Department (HOSPITAL_COMMUNITY)
Admission: EM | Admit: 2021-05-08 | Discharge: 2021-05-08 | Disposition: A | Discharge status: Home / Self Care | Payer: Medicaid Other | Attending: Emergency Medicine | Admitting: Emergency Medicine

## 2021-05-08 ENCOUNTER — Other Ambulatory Visit: Payer: Self-pay

## 2021-05-08 DIAGNOSIS — N289 Disorder of kidney and ureter, unspecified: Secondary | ICD-10-CM | POA: Diagnosis not present

## 2021-05-08 DIAGNOSIS — R109 Unspecified abdominal pain: Secondary | ICD-10-CM | POA: Diagnosis present

## 2021-05-08 DIAGNOSIS — N201 Calculus of ureter: Secondary | ICD-10-CM | POA: Diagnosis not present

## 2021-05-08 DIAGNOSIS — N200 Calculus of kidney: Secondary | ICD-10-CM | POA: Insufficient documentation

## 2021-05-08 DIAGNOSIS — Z79899 Other long term (current) drug therapy: Secondary | ICD-10-CM | POA: Insufficient documentation

## 2021-05-08 DIAGNOSIS — I700 Atherosclerosis of aorta: Secondary | ICD-10-CM | POA: Diagnosis not present

## 2021-05-08 LAB — BASIC METABOLIC PANEL
Anion gap: 6 (ref 5–15)
BUN: 11 mg/dL (ref 6–20)
CO2: 28 mmol/L (ref 22–32)
Calcium: 8.7 mg/dL — ABNORMAL LOW (ref 8.9–10.3)
Chloride: 106 mmol/L (ref 98–111)
Creatinine, Ser: 1.05 mg/dL (ref 0.61–1.24)
GFR, Estimated: >60 mL/min (ref >60)
Glucose, Bld: 106 mg/dL — ABNORMAL HIGH (ref 70–99)
Potassium: 3.7 mmol/L (ref 3.5–5.1)
Sodium: 140 mmol/L (ref 135–145)

## 2021-05-08 LAB — CBC WITH DIFFERENTIAL/PLATELET
Abs Immature Granulocytes: 0.02 10*3/uL (ref 0.00–0.07)
Basophils Absolute: 0 10*3/uL (ref 0.0–0.1)
Basophils Relative: 0 %
Eosinophils Absolute: 0.2 10*3/uL (ref 0.0–0.5)
Eosinophils Relative: 3 %
HCT: 46.7 % (ref 39.0–52.0)
Hemoglobin: 15.7 g/dL (ref 13.0–17.0)
Immature Granulocytes: 0 %
Lymphocytes Relative: 23 %
Lymphs Abs: 1.9 10*3/uL (ref 0.7–4.0)
MCH: 30.2 pg (ref 26.0–34.0)
MCHC: 33.6 g/dL (ref 30.0–36.0)
MCV: 89.8 fL (ref 80.0–100.0)
Monocytes Absolute: 0.6 10*3/uL (ref 0.1–1.0)
Monocytes Relative: 7 %
Neutro Abs: 5.5 10*3/uL (ref 1.7–7.7)
Neutrophils Relative %: 67 %
Platelets: 220 10*3/uL (ref 150–400)
RBC: 5.2 MIL/uL (ref 4.22–5.81)
RDW: 12 % (ref 11.5–15.5)
WBC: 8.2 10*3/uL (ref 4.0–10.5)
nRBC: 0 % (ref 0.0–0.2)

## 2021-05-08 LAB — URINALYSIS, ROUTINE W REFLEX MICROSCOPIC
Bacteria, UA: NONE SEEN
Bilirubin Urine: NEGATIVE
Glucose, UA: NEGATIVE mg/dL
Ketones, ur: NEGATIVE mg/dL
Leukocytes,Ua: NEGATIVE
Nitrite: NEGATIVE
Protein, ur: NEGATIVE mg/dL
RBC / HPF: >50 RBC/hpf — ABNORMAL HIGH (ref 0–5)
Specific Gravity, Urine: 1.016 (ref 1.005–1.030)
pH: 6 (ref 5.0–8.0)

## 2021-05-08 MED ORDER — TAMSULOSIN HCL 0.4 MG PO CAPS: 0.4000 mg | ORAL_CAPSULE | Freq: Every day | ORAL | 0 refills | Status: DC

## 2021-05-08 MED ORDER — ONDANSETRON HCL 4 MG PO TABS: 4.0000 mg | ORAL_TABLET | Freq: Four times a day (QID) | ORAL | 0 refills | Status: DC

## 2021-05-08 MED ORDER — OXYCODONE-ACETAMINOPHEN 5-325 MG PO TABS
1.0000 | ORAL_TABLET | Freq: Once | ORAL | Status: AC
  Administered 2021-05-08: 1 via ORAL
  Filled 2021-05-08: qty 1

## 2021-05-08 MED ORDER — OXYCODONE-ACETAMINOPHEN 5-325 MG PO TABS
1.0000 | ORAL_TABLET | Freq: Four times a day (QID) | ORAL | 0 refills | Status: DC | PRN
Start: 2021-05-08 — End: 2021-06-17

## 2021-05-08 NOTE — ED Provider Notes (Signed)
[facility] EMERGENCY DEPARTMENT Provider Note   CSN: [id number] Arrival date & time: 05/08/21  1855     History  Chief Complaint  Patient presents with   Flank Pain    Curtis Snow is a 46 y.o. male.  Patient complains of left flank pain.  Patient has a history of hypertension  The history is provided by the patient and medical records. No language interpreter was used.  Flank Pain This is a new problem. The current episode started 12 to 24 hours ago. The problem occurs constantly. The problem has not changed since onset.Pertinent negatives include no chest pain, no abdominal pain and no headaches. Nothing aggravates the symptoms. Nothing relieves the symptoms. He has tried nothing for the symptoms.      Home Medications Prior to Admission medications   Medication Sig Start Date End Date Taking? Authorizing Provider  ondansetron (ZOFRAN) 4 MG tablet Take 1 tablet (4 mg total) by mouth every 6 (six) hours. 05/08/21  Yes Milton Ferguson, MD  oxyCODONE-acetaminophen (PERCOCET) 5-325 MG tablet Take 1 tablet by mouth every 6 (six) hours as needed. 05/08/21  Yes Milton Ferguson, MD  tamsulosin (FLOMAX) 0.4 MG CAPS capsule Take 1 capsule (0.4 mg total) by mouth daily. 05/08/21  Yes Milton Ferguson, MD  losartan (COZAAR) 50 MG tablet Take 1 tablet (50 mg total) by mouth daily. 02/12/21   Jaynee Eagles, PA-C      Allergies    Penicillins    Review of Systems   Review of Systems  Constitutional:  Negative for appetite change and fatigue.  HENT:  Negative for congestion, ear discharge and sinus pressure.   Eyes:  Negative for discharge.  Respiratory:  Negative for cough.   Cardiovascular:  Negative for chest pain.  Gastrointestinal:  Negative for abdominal pain and diarrhea.  Genitourinary:  Positive for flank pain. Negative for frequency and hematuria.  Musculoskeletal:  Negative for back pain.  Skin:  Negative for rash.  Neurological:  Negative for seizures and headaches.   Psychiatric/Behavioral:  Negative for hallucinations.    Physical Exam Updated Vital Signs BP (!) 150/97 (BP Location: Right Arm)    Pulse 76    Temp 97.7 F (36.5 C) (Oral)    Resp 17    Ht 6\' 2"  (1.88 m)    Wt 81.6 kg    SpO2 98%    BMI 23.11 kg/m  Physical Exam Vitals and nursing note reviewed.  Constitutional:      Appearance: He is well-developed.  HENT:     Head: Normocephalic.     Nose: Nose normal.  Eyes:     General: No scleral icterus.    Conjunctiva/sclera: Conjunctivae normal.  Neck:     Thyroid: No thyromegaly.  Cardiovascular:     Rate and Rhythm: Normal rate and regular rhythm.     Heart sounds: No murmur heard.   No friction rub. No gallop.  Pulmonary:     Breath sounds: No stridor. No wheezing or rales.  Chest:     Chest wall: No tenderness.  Abdominal:     General: There is no distension.     Tenderness: There is no abdominal tenderness. There is no rebound.  Musculoskeletal:        General: Normal range of motion.     Cervical back: Neck supple.     Comments: Tender left flank  Lymphadenopathy:     Cervical: No cervical adenopathy.  Skin:    Findings: No erythema or rash.  Neurological:  Mental Status: He is alert and oriented to person, place, and time.     Motor: No abnormal muscle tone.     Coordination: Coordination normal.  Psychiatric:        Behavior: Behavior normal.    ED Results / Procedures / Treatments   Labs (all labs ordered are listed, but only abnormal results are displayed) Labs Reviewed  BASIC METABOLIC PANEL - Abnormal; Notable for the following components:      Result Value   Glucose, Bld 106 (*)    Calcium 8.7 (*)    All other components within normal limits  URINALYSIS, ROUTINE W REFLEX MICROSCOPIC - Abnormal; Notable for the following components:   Hgb urine dipstick MODERATE (*)    RBC / HPF >50 (*)    All other components within normal limits  CBC WITH DIFFERENTIAL/PLATELET    EKG None  Radiology CT  Renal Stone Study  Result Date: 05/08/2021 CLINICAL DATA:  Nephrolithiasis.  Left flank pain EXAM: CT ABDOMEN AND PELVIS WITHOUT CONTRAST TECHNIQUE: Multidetector CT imaging of the abdomen and pelvis was performed following the standard protocol without IV contrast. COMPARISON:  None. FINDINGS: Lower chest: No acute abnormality. Hepatobiliary: No focal hepatic abnormality. Gallbladder unremarkable. Pancreas: No focal abnormality or ductal dilatation. Spleen: No focal abnormality.  Normal size. Adrenals/Urinary Tract: Punctate nonobstructing stones in the mid and lower pole of the right kidney. No left ureteral stones. No hydronephrosis. There is 3 mm distal left ureteral stone at the left UVJ, nonobstructing. Bilateral low-density lesions in the kidneys, likely cysts although difficult to characterize on this noncontrast study. Adrenal glands and urinary bladder unremarkable. Stomach/Bowel: Normal appendix. Stomach, large and small bowel grossly unremarkable. Vascular/Lymphatic: Scattered aortic calcifications. No evidence of aneurysm or adenopathy. Reproductive: No visible focal abnormality. Other: No free fluid or free air. Musculoskeletal: No acute bony abnormality. IMPRESSION: 3 mm distal left ureteral stone.  No hydronephrosis. Right nephrolithiasis. Low-density lesions in the kidneys are difficult to characterize without intravenous contrast but likely cysts. Aortic atherosclerosis. Electronically Signed   By: Rolm Baptise M.D.   On: 05/08/2021 20:10    Procedures Procedures    Medications Ordered in ED Medications - No data to display  ED Course/ Medical Decision Making/ A&P                           Medical Decision Making  Patient with a 3 mm left ureteral stone.  Patient will be discharged home with Percocet Zofran and Flomax and follow-up with urology   This patient presents to the ED for concern of flank pain, this involves an extensive number of treatment options, and is a complaint  that carries with it a high risk of complications and morbidity.  The differential diagnosis includes pyelonephritis, kidney stone, retroperitoneal hemorrhage   Co morbidities that complicate the patient evaluation  Hypertension   Additional history obtained:  Additional history obtained from patient External records from outside source obtained and reviewed including hospital records   Lab Tests:  I Ordered, and personally interpreted labs.  The pertinent results include: Urinalysis that shows a lot of blood but no white cells   Imaging Studies ordered:  I ordered imaging studies including CT abdomen I independently visualized and interpreted imaging which showed 3 mm left ureteral stone I agree with the radiologist interpretation   Cardiac Monitoring:  No monitor use  Medicines ordered and prescription drug management: Patient given no pain medicine or any medicine  to the ED Reevaluation of the patient after these medicines showed that the patient stayed the same I have reviewed the patients home medicines and have made adjustments as needed   Test Considered: Urine culture   Critical Interventions:  None   Consultations Obtained:  No consult  Problem List / ED Course:  Ureteral stone   Reevaluation:  After the interventions noted above, I reevaluated the patient and found that they have :stayed the same   Social Determinants of Health:  None   Dispostion:  After consideration of the diagnostic results and the patients response to treatment, I feel that the patent would benefit from discharge home with pain medicine, nausea medicine, Flomax and follow-up with urology.         Final Clinical Impression(s) / ED Diagnoses Final diagnoses:  Kidney stone    Rx / DC Orders ED Discharge Orders          Ordered    oxyCODONE-acetaminophen (PERCOCET) 5-325 MG tablet  Every 6 hours PRN        05/08/21 2159    ondansetron (ZOFRAN) 4 MG tablet   Every 6 hours        05/08/21 2200    tamsulosin (FLOMAX) 0.4 MG CAPS capsule  Daily        05/08/21 2200              Milton Ferguson, MD 05/08/21 2205

## 2021-05-08 NOTE — Discharge Instructions (Signed)
Follow-up with [facility] in [place] this week.  Drink plenty of fluids.  Return if any problems

## 2021-05-08 NOTE — ED Triage Notes (Signed)
Pt pressure to bladder for a week, denies any burning with urination.  Increase in frequency.  Left flank pain started today.  + nausea but denies emesis.

## 2021-05-08 NOTE — ED Notes (Signed)
See triage notes. nad 

## 2021-05-09 ENCOUNTER — Telehealth: Payer: Self-pay

## 2021-05-09 LAB — URINE CULTURE
MICRO NUMBER:: 12840090
Result:: NO GROWTH
SPECIMEN QUALITY:: ADEQUATE

## 2021-05-09 LAB — HOUSE ACCOUNT TRACKING

## 2021-05-09 NOTE — Telephone Encounter (Signed)
Transition Care Management Follow-up Telephone Call Date of discharge and from where: 05/08/2021 from [facility] How have you been since you were released from the hospital? Pt stated that he is feeling well. Pt did not have any questions or concerns.  Any questions or concerns? No  Items Reviewed: Did the pt receive and understand the discharge instructions provided? Yes  Medications obtained and verified? Yes  Other? No  Any new allergies since your discharge? No  Dietary orders reviewed? No Do you have support at home? Yes   Functional Questionnaire: (I = Independent and D = Dependent) ADLs: I Bathing/Dressing- I Meal Prep- I Eating- I Maintaining continence- I Transferring/Ambulation- I Managing Meds- I   Follow up appointments reviewed: PCP Hospital f/u appt confirmed? Yes  Scheduled to see Merri Ray, MD on 06/17/2021 @ [facility] f/u appt confirmed? No   Are transportation arrangements needed? No  If their condition worsens, is the pt aware to call PCP or go to the Emergency Dept.? Yes Was the patient provided with contact information for the PCP's office or ED? Yes Was to pt encouraged to call back with questions or concerns? Yes

## 2021-05-13 ENCOUNTER — Other Ambulatory Visit: Payer: Medicaid Other

## 2021-05-13 DIAGNOSIS — R002 Palpitations: Secondary | ICD-10-CM

## 2021-05-13 DIAGNOSIS — I10 Essential (primary) hypertension: Secondary | ICD-10-CM

## 2021-05-13 DIAGNOSIS — R350 Frequency of micturition: Secondary | ICD-10-CM

## 2021-05-13 DIAGNOSIS — Z13220 Encounter for screening for lipoid disorders: Secondary | ICD-10-CM

## 2021-05-13 NOTE — Addendum Note (Signed)
Addended by: Adah Salvage F on: 05/13/2021 04:23 PM   Modules accepted: Orders

## 2021-05-14 LAB — CBC
HCT: 45.7 % (ref 38.5–50.0)
Hemoglobin: 16 g/dL (ref 13.2–17.1)
MCH: 30.3 pg (ref 27.0–33.0)
MCHC: 35 g/dL (ref 32.0–36.0)
MCV: 86.6 fL (ref 80.0–100.0)
MPV: 10.7 fL (ref 7.5–12.5)
Platelets: 240 10*3/uL (ref 140–400)
RBC: 5.28 10*6/uL (ref 4.20–5.80)
RDW: 11.9 % (ref 11.0–15.0)
WBC: 7.1 10*3/uL (ref 3.8–10.8)

## 2021-05-14 LAB — COMPREHENSIVE METABOLIC PANEL
AG Ratio: 1.6 (calc) (ref 1.0–2.5)
ALT: 10 U/L (ref 9–46)
AST: 14 U/L (ref 10–40)
Albumin: 4.1 g/dL (ref 3.6–5.1)
Alkaline phosphatase (APISO): 72 U/L (ref 36–130)
BUN: 12 mg/dL (ref 7–25)
CO2: 28 mmol/L (ref 20–32)
Calcium: 9 mg/dL (ref 8.6–10.3)
Chloride: 105 mmol/L (ref 98–110)
Creat: 0.99 mg/dL (ref 0.60–1.29)
Globulin: 2.5 g/dL (calc) (ref 1.9–3.7)
Glucose, Bld: 97 mg/dL (ref 65–99)
Potassium: 4.1 mmol/L (ref 3.5–5.3)
Sodium: 141 mmol/L (ref 135–146)
Total Bilirubin: 0.7 mg/dL (ref 0.2–1.2)
Total Protein: 6.6 g/dL (ref 6.1–8.1)

## 2021-05-14 LAB — LIPID PANEL
Cholesterol: 134 mg/dL (ref <200)
HDL: 31 mg/dL — ABNORMAL LOW (ref >=40)
LDL Cholesterol (Calc): 85 mg/dL (calc)
Non-HDL Cholesterol (Calc): 103 mg/dL (calc) (ref <130)
Total CHOL/HDL Ratio: 4.3 (calc) (ref <5.0)
Triglycerides: 85 mg/dL (ref <150)

## 2021-05-14 LAB — TSH: TSH: 3.84 mIU/L (ref 0.40–4.50)

## 2021-05-14 LAB — PSA: PSA: 1.1 ng/mL (ref <=4.00)

## 2021-05-31 ENCOUNTER — Telehealth: Payer: Self-pay | Admitting: Family Medicine

## 2021-05-31 ENCOUNTER — Other Ambulatory Visit: Payer: Self-pay

## 2021-05-31 DIAGNOSIS — I10 Essential (primary) hypertension: Secondary | ICD-10-CM

## 2021-05-31 MED ORDER — LOSARTAN POTASSIUM 50 MG PO TABS: 50.0000 mg | ORAL_TABLET | Freq: Every day | ORAL | 1 refills | Status: DC

## 2021-05-31 NOTE — Telephone Encounter (Signed)
Pt called in asking for a refill on the losartan, pt uses [facility] in [place] 

## 2021-05-31 NOTE — Telephone Encounter (Signed)
sent 

## 2021-06-17 ENCOUNTER — Ambulatory Visit: Payer: Medicaid Other | Admitting: Family Medicine

## 2021-06-17 VITALS — BP 124/68 | HR 47 | Temp 98.1°F | Resp 16 | Ht 74.0 in | Wt 178.4 lb

## 2021-06-17 DIAGNOSIS — R002 Palpitations: Secondary | ICD-10-CM | POA: Diagnosis not present

## 2021-06-17 DIAGNOSIS — R3915 Urgency of urination: Secondary | ICD-10-CM | POA: Diagnosis not present

## 2021-06-17 DIAGNOSIS — I10 Essential (primary) hypertension: Secondary | ICD-10-CM

## 2021-06-17 DIAGNOSIS — N200 Calculus of kidney: Secondary | ICD-10-CM

## 2021-06-17 NOTE — Patient Instructions (Addendum)
If palpitations do not resolve, let me know and I can refer you to cardiology. Blood pressure looks better. Keep up the good work with cutting back on soda, smoking.   Prostate test was reassuring last visit.  Previous urine test did not indicate infection, just blood likely from kidney stone.  I will refer you to urologist to follow-up on the kidney stones and discussed urgent urination but if any worsening or new symptoms be seen right away.  Recheck 6 months if symptoms are stable.

## 2021-06-17 NOTE — Progress Notes (Signed)
Subjective:  Patient ID: Curtis Snow, male    DOB: 20-Aug-1975  Age: 46 y.o. MRN: [id number]  CC:  Chief Complaint  Patient presents with   Results    Pt here to discuss results from last visit    Hypertension    Here for recheck on BP     HPI [name] presents for   Hypertension: Losartan 50 mg daily.  Some elevated readings noted at last visit.  Handout given on management of hypertension, with borderline readings decided to maintain same regimen.  Improved today.  He was cutting back on smoking at last visit.  Also discussed palpitations last visit, question food related as nighttime symptoms.  No acute findings on EKG, TSH, CBC reassuring.  Option of cardiology follow-up with persistent. Cut back on soda, less smoking, less palpitations. No chest pain.   Lab Results  Component Value Date   TSH 3.84 05/13/2021   Lab Results  Component Value Date   WBC 7.1 05/13/2021   HGB 16.0 05/13/2021   HCT 45.7 05/13/2021   MCV 86.6 05/13/2021   PLT 240 05/13/2021   BP Readings from Last 3 Encounters:  06/17/21 124/68  05/08/21 (!) 148/97  05/06/21 138/80   Lab Results  Component Value Date   CREATININE 0.99 05/13/2021   Nephrolithiasis: Still some urinary urgency, no burning/blood in urine. Seen in ER 1/8. Treated with oxycodone, flomax. Assuming he passed it, no pain now.   Prior urologist in [place], years ago.  No nitrite/le on prior UA and normal PSA.  CT 05/08/21: IMPRESSION: 3 mm distal left ureteral stone.  No hydronephrosis.   Right nephrolithiasis.   Low-density lesions in the kidneys are difficult to characterize without intravenous contrast but likely cysts.   Aortic atherosclerosis.  History Patient Active Problem List   Diagnosis Date Noted   HTN (hypertension) 11/18/2019   Rectal bleeding 05/13/2019   Smoking    Past Medical History:  Diagnosis Date   Anxiety    DDD (degenerative disc disease)    saw Dr. Ace Gins   Hypertension     Smoking    Past Surgical History:  Procedure Laterality Date   COLONOSCOPY N/A 05/20/2019   Procedure: COLONOSCOPY;  Surgeon: Daneil Dolin, MD;  Location: [facility];  Service: Endoscopy;  Laterality: N/A;  1:30pm   NO PAST SURGERIES     POLYPECTOMY  05/20/2019   Procedure: POLYPECTOMY;  Surgeon: Daneil Dolin, MD;  Location: [facility];  Service: Endoscopy;;  hepatic flexure   Allergies  Allergen Reactions   Penicillins Shortness Of Breath, Nausea And Vomiting and Rash    Did it involve swelling of the face/tongue/throat, SOB, or low BP? Yes Did it involve sudden or severe rash/hives, skin peeling, or any reaction on the inside of your mouth or nose? Yes Did you need to seek medical attention at a hospital or doctor's office? Yes When did it last happen?      childhood allergy If all above answers are NO, may proceed with cephalosporin use.    Prior to Admission medications   Medication Sig Start Date End Date Taking? Authorizing Provider  losartan (COZAAR) 50 MG tablet Take 1 tablet (50 mg total) by mouth daily. 05/31/21  Yes Wendie Agreste, MD  ondansetron (ZOFRAN) 4 MG tablet Take 1 tablet (4 mg total) by mouth every 6 (six) hours. 05/08/21  Yes Milton Ferguson, MD  oxyCODONE-acetaminophen (PERCOCET) 5-325 MG tablet Take 1 tablet by mouth every 6 (  six) hours as needed. 05/08/21  Yes Milton Ferguson, MD  tamsulosin (FLOMAX) 0.4 MG CAPS capsule Take 1 capsule (0.4 mg total) by mouth daily. 05/08/21  Yes Milton Ferguson, MD   Social History   Socioeconomic History   Marital status: Married    Spouse name: Not on file   Number of children: Not on file   Years of education: Not on file   Highest education level: Not on file  Occupational History   Occupation: welder  Tobacco Use   Smoking status: Every Day    Packs/day: 1.00    Types: Cigarettes   Smokeless tobacco: Never   Tobacco comments:    occ vapes  Substance and Sexual Activity   Alcohol use: No    Comment:  rare   Drug use: No   Sexual activity: Yes    Birth control/protection: None  Other Topics Concern   Not on file  Social History Narrative   Married since 2014.Unemployed.Lives with wife and son.   Social Determinants of Health   Financial Resource Strain: Not on file  Food Insecurity: Not on file  Transportation Needs: Not on file  Physical Activity: Not on file  Stress: Not on file  Social Connections: Not on file  Intimate Partner Violence: Not on file    Review of Systems  Constitutional:  Negative for fatigue and unexpected weight change.  Eyes:  Negative for visual disturbance.  Respiratory:  Negative for cough, chest tightness and shortness of breath.   Cardiovascular:  Positive for palpitations (less frequent. with meals at times). Negative for chest pain and leg swelling.  Gastrointestinal:  Negative for abdominal pain and blood in stool.  Genitourinary:  Positive for urgency. Negative for difficulty urinating, flank pain, frequency and hematuria.  Neurological:  Negative for dizziness, light-headedness and headaches.   Objective:   Vitals:   06/17/21 0905  BP: 124/68  Pulse: (!) 47  Resp: 16  Temp: 98.1 F (36.7 C)  TempSrc: Temporal  SpO2: 97%  Weight: 178 lb 6.4 oz (80.9 kg)  Height: 6\' 2"  (1.88 m)     Physical Exam Vitals reviewed.  Constitutional:      Appearance: He is well-developed.  HENT:     Head: Normocephalic and atraumatic.  Neck:     Vascular: No carotid bruit or JVD.  Cardiovascular:     Rate and Rhythm: Normal rate and regular rhythm.     Heart sounds: Normal heart sounds. No murmur heard. Pulmonary:     Effort: Pulmonary effort is normal.     Breath sounds: Normal breath sounds. No rales.  Musculoskeletal:     Right lower leg: No edema.     Left lower leg: No edema.  Skin:    General: Skin is warm and dry.  Neurological:     Mental Status: He is alert and oriented to person, place, and time.  Psychiatric:        Mood and  Affect: Mood normal.       Assessment & Plan:  Curtis Snow is a 46 y.o. male . Nephrolithiasis - Plan: Ambulatory referral to Urology Urinary urgency - Plan: Ambulatory referral to Urology  -Prior nephrolithiasis, no current symptoms.  Does have some urinary urgency but no other urinary symptoms.  Refer to urology for ongoing care, previous PSA, urinalysis overall reassuring.  RTC precautions if new or worsening symptoms.  Palpitations  -Significantly improved, associated with food.  Cardiology follow-up discussed if persistent or worsening symptoms.  Essential hypertension  -  Commended on diet changes, decrease sodas and decrease smoking.  Improved blood pressure.  Continue same dose losartan for now.  No orders of the defined types were placed in this encounter.  Patient Instructions  If palpitations do not resolve, let me know and I can refer you to cardiology. Blood pressure looks better. Keep up the good work with cutting back on soda, smoking.   Prostate test was reassuring last visit.  Previous urine test did not indicate infection, just blood likely from kidney stone.  I will refer you to urologist to follow-up on the kidney stones and discussed urgent urination but if any worsening or new symptoms be seen right away.  Recheck 6 months if symptoms are stable.    Signed,   Merri Ray, MD [place] 06/17/21 9:48 AM

## 2021-06-22 ENCOUNTER — Encounter: Payer: Self-pay | Admitting: *Deleted

## 2021-08-12 ENCOUNTER — Emergency Department (HOSPITAL_COMMUNITY): Payer: Medicaid Other

## 2021-08-12 ENCOUNTER — Other Ambulatory Visit: Payer: Self-pay

## 2021-08-12 ENCOUNTER — Encounter (HOSPITAL_COMMUNITY): Payer: Self-pay

## 2021-08-12 ENCOUNTER — Emergency Department (HOSPITAL_COMMUNITY)
Admission: EM | Admit: 2021-08-12 | Discharge: 2021-08-12 | Disposition: A | Discharge status: Home / Self Care | Payer: Medicaid Other | Attending: Emergency Medicine | Admitting: Emergency Medicine

## 2021-08-12 DIAGNOSIS — N281 Cyst of kidney, acquired: Secondary | ICD-10-CM | POA: Diagnosis not present

## 2021-08-12 DIAGNOSIS — N201 Calculus of ureter: Secondary | ICD-10-CM | POA: Insufficient documentation

## 2021-08-12 DIAGNOSIS — N132 Hydronephrosis with renal and ureteral calculous obstruction: Secondary | ICD-10-CM | POA: Diagnosis not present

## 2021-08-12 DIAGNOSIS — R109 Unspecified abdominal pain: Secondary | ICD-10-CM | POA: Diagnosis present

## 2021-08-12 DIAGNOSIS — I700 Atherosclerosis of aorta: Secondary | ICD-10-CM | POA: Diagnosis not present

## 2021-08-12 DIAGNOSIS — R6883 Chills (without fever): Secondary | ICD-10-CM | POA: Diagnosis not present

## 2021-08-12 LAB — CBC WITH DIFFERENTIAL/PLATELET
Abs Immature Granulocytes: 0.02 10*3/uL (ref 0.00–0.07)
Basophils Absolute: 0 10*3/uL (ref 0.0–0.1)
Basophils Relative: 0 %
Eosinophils Absolute: 0.2 10*3/uL (ref 0.0–0.5)
Eosinophils Relative: 3 %
HCT: 43.8 % (ref 39.0–52.0)
Hemoglobin: 14.8 g/dL (ref 13.0–17.0)
Immature Granulocytes: 0 %
Lymphocytes Relative: 22 %
Lymphs Abs: 1.2 10*3/uL (ref 0.7–4.0)
MCH: 29.5 pg (ref 26.0–34.0)
MCHC: 33.8 g/dL (ref 30.0–36.0)
MCV: 87.4 fL (ref 80.0–100.0)
Monocytes Absolute: 0.5 10*3/uL (ref 0.1–1.0)
Monocytes Relative: 10 %
Neutro Abs: 3.4 10*3/uL (ref 1.7–7.7)
Neutrophils Relative %: 65 %
Platelets: 207 10*3/uL (ref 150–400)
RBC: 5.01 MIL/uL (ref 4.22–5.81)
RDW: 12.2 % (ref 11.5–15.5)
WBC: 5.3 10*3/uL (ref 4.0–10.5)
nRBC: 0 % (ref 0.0–0.2)

## 2021-08-12 LAB — BASIC METABOLIC PANEL
Anion gap: 4 — ABNORMAL LOW (ref 5–15)
BUN: 16 mg/dL (ref 6–20)
CO2: 29 mmol/L (ref 22–32)
Calcium: 8.8 mg/dL — ABNORMAL LOW (ref 8.9–10.3)
Chloride: 109 mmol/L (ref 98–111)
Creatinine, Ser: 0.97 mg/dL (ref 0.61–1.24)
GFR, Estimated: >60 mL/min (ref >60)
Glucose, Bld: 84 mg/dL (ref 70–99)
Potassium: 3.6 mmol/L (ref 3.5–5.1)
Sodium: 142 mmol/L (ref 135–145)

## 2021-08-12 MED ORDER — NAPROXEN 500 MG PO TABS: 500.0000 mg | ORAL_TABLET | Freq: Two times a day (BID) | ORAL | 0 refills | Status: DC

## 2021-08-12 MED ORDER — HYDROCODONE-ACETAMINOPHEN 5-325 MG PO TABS
1.0000 | ORAL_TABLET | Freq: Four times a day (QID) | ORAL | 0 refills | Status: DC | PRN
Start: 2021-08-12 — End: 2023-11-07

## 2021-08-12 MED ORDER — ONDANSETRON HCL 4 MG/2ML IJ SOLN
4.0000 mg | Freq: Once | INTRAMUSCULAR | Status: AC
Start: 2021-08-12 — End: 2021-08-12
  Administered 2021-08-12: 4 mg via INTRAVENOUS
  Filled 2021-08-12: qty 2

## 2021-08-12 MED ORDER — ONDANSETRON 4 MG PO TBDP: 4.0000 mg | ORAL_TABLET | Freq: Three times a day (TID) | ORAL | 0 refills | Status: DC | PRN

## 2021-08-12 NOTE — ED Provider Notes (Addendum)
?[facility] ?Provider Note ? ? ?CSN: [id number] ?Arrival date & time: 08/12/21  0701 ? ?  ? ?History ? ?Chief Complaint  ?Patient presents with  ? Flank Pain  ? ? ?Curtis Snow is a 46 y.o. male. ? ?Patient with a complaint of right-sided back right flank pain on and off for few days but became more severe today.  Patient's had a history of kidney stones it does remind him of that.  He has had increased urine frequency but no hematuria no fevers but occasional chills no vomiting.  And has had nausea. ? ? ?  ? ?Home Medications ?Prior to Admission medications   ?Medication Sig Start Date End Date Taking? Authorizing Provider  ?losartan (COZAAR) 50 MG tablet Take 1 tablet (50 mg total) by mouth daily. 05/31/21  Yes Wendie Agreste, MD  ?tamsulosin (FLOMAX) 0.4 MG CAPS capsule Take 1 capsule (0.4 mg total) by mouth daily. ?Patient not taking: Reported on 08/12/2021 05/08/21   Milton Ferguson, MD  ?   ? ?Allergies    ?Penicillins   ? ?Review of Systems   ?Review of Systems  ?Constitutional:  Positive for chills. Negative for fever.  ?HENT:  Negative for ear pain and sore throat.   ?Eyes:  Negative for pain and visual disturbance.  ?Respiratory:  Negative for cough and shortness of breath.   ?Cardiovascular:  Negative for chest pain and palpitations.  ?Gastrointestinal:  Positive for nausea. Negative for abdominal pain and vomiting.  ?Genitourinary:  Positive for flank pain and frequency. Negative for dysuria and hematuria.  ?Musculoskeletal:  Positive for back pain. Negative for arthralgias.  ?Skin:  Negative for color change and rash.  ?Neurological:  Negative for seizures and syncope.  ?All other systems reviewed and are negative. ? ?Physical Exam ?Updated Vital Signs ?BP (!) 137/91   Pulse 62   Temp 97.8 ?F (36.6 ?C) (Oral)   Resp 18   Wt 81.6 kg   SpO2 97%   BMI 23.11 kg/m?  ?Physical Exam ?Vitals and nursing note reviewed.  ?Constitutional:   ?   General: He is not in acute distress. ?    Appearance: Normal appearance. He is well-developed. He is not ill-appearing.  ?HENT:  ?   Head: Normocephalic and atraumatic.  ?Eyes:  ?   Conjunctiva/sclera: Conjunctivae normal.  ?Cardiovascular:  ?   Rate and Rhythm: Normal rate and regular rhythm.  ?   Heart sounds: No murmur heard. ?Pulmonary:  ?   Effort: Pulmonary effort is normal. No respiratory distress.  ?   Breath sounds: Normal breath sounds.  ?Abdominal:  ?   Palpations: Abdomen is soft.  ?   Tenderness: There is no abdominal tenderness. There is no guarding.  ?Musculoskeletal:     ?   General: No swelling.  ?   Cervical back: Normal range of motion and neck supple.  ?Skin: ?   General: Skin is warm and dry.  ?   Capillary Refill: Capillary refill takes less than 2 seconds.  ?Neurological:  ?   General: No focal deficit present.  ?   Mental Status: He is alert and oriented to person, place, and time.  ?Psychiatric:     ?   Mood and Affect: Mood normal.  ? ? ?ED Results / Procedures / Treatments   ?Labs ?(all labs ordered are listed, but only abnormal results are displayed) ?Labs Reviewed  ?BASIC METABOLIC PANEL - Abnormal; Notable for the following components:  ?    Result  Value  ? Calcium 8.8 (*)   ? Anion gap 4 (*)   ? All other components within normal limits  ?CBC WITH DIFFERENTIAL/PLATELET  ?URINALYSIS, ROUTINE W REFLEX MICROSCOPIC  ? ? ?EKG ?None ? ?Radiology ?CT Renal Stone Study ? ?Result Date: 08/12/2021 ?CLINICAL DATA:  Right flank pain for several days, nausea, chills history of kidney stones EXAM: CT ABDOMEN AND PELVIS WITHOUT CONTRAST TECHNIQUE: Multidetector CT imaging of the abdomen and pelvis was performed following the standard protocol without IV contrast. RADIATION DOSE REDUCTION: This exam was performed according to the departmental dose-optimization program which includes automated exposure control, adjustment of the mA and/or kV according to patient size and/or use of iterative reconstruction technique. COMPARISON:  CT  abdomen/pelvis 05/08/2021 FINDINGS: Lower chest: The lung bases are clear. The imaged heart is unremarkable. Hepatobiliary: The liver is unremarkable, within the confines of noncontrast technique. The gallbladder is unremarkable. There is no biliary ductal dilatation. Pancreas: Unremarkable. Spleen: Unremarkable. Adrenals/Urinary Tract: The adrenals are unremarkable. Numerous renal cysts are again seen bilaterally, for which no specific imaging follow-up is required. There are a few punctate nonobstructing renal stones bilaterally. There is a 4 mm stone at the right UVJ with trace hydroureter but no frank hydronephrosis. There are no obstructing stones on the left. There is no left hydroureteronephrosis. The bladder is unremarkable. Stomach/Bowel: The stomach is unremarkable. There is no evidence of bowel obstruction there is no abnormal bowel wall thickening or inflammatory change. The appendix is normal. Vascular/Lymphatic: There is scattered calcified atherosclerotic plaque in the nonaneurysmal abdominal aorta. There is no abdominal or pelvic lymphadenopathy. Reproductive: Prostate and seminal vesicles are unremarkable. Other: There is no ascites or free air. Musculoskeletal: There is no acute osseous abnormality or aggressive osseous lesion. IMPRESSION: 1. 4 mm stone at the right UVJ with mild upstream hydroureter but no frank hydronephrosis. 2. Additional punctate nonobstructing stones bilaterally. 3.  Aortic Atherosclerosis (ICD10-I70.0). Electronically Signed   By: Valetta Mole M.D.   On: 08/12/2021 08:46   ? ?Procedures ?Procedures  ? ? ?Medications Ordered in ED ?Medications  ?ondansetron (ZOFRAN) injection 4 mg (4 mg Intravenous Given 08/12/21 0833)  ? ? ?ED Course/ Medical Decision Making/ A&P ?  ?                        ?Medical Decision Making ?Amount and/or Complexity of Data Reviewed ?Labs: ordered. ?Radiology: ordered. ? ?Risk ?Prescription drug management. ? ?Patient nontoxic no acute distress.   Symptoms could be musculoskeletal could be related to a kidney stone.  Will get CT renal.  CBC normal no leukocytosis hemoglobin normal.  Basic metabolic panel normal as well.  BUN and creatinine is normal.  Urinalysis is pending. ? ?CBC normal basic metabolic panel normal renal function normal.  CT scan shows a 4 mm right UVJ calculus.  No frank hydronephrosis. ? ?Patient drove himself here so we will treat symptomatically with medications for him to pick up and have him follow-up with urology.  Most likely will pass this later today. ? ?Final Clinical Impression(s) / ED Diagnoses ?Final diagnoses:  ?Right ureteral stone  ? ? ?Rx / DC Orders ?ED Discharge Orders   ? ? None  ? ?  ? ? ?  ?Fredia Sorrow, MD ?08/12/21 [phone] ? ?  ?Fredia Sorrow, MD ?08/12/21 [phone] ? ?

## 2021-08-12 NOTE — Discharge Instructions (Signed)
Take the Naprosyn on a regular basis.  Take the Zofran as needed for nausea vomiting.  Supplement with the hydrocodone.  Make an appointment follow-up with urology.  CT scan confirmed a right-sided kidney stone.  Return for any new or worse symptoms.  Work note provided. ?

## 2021-08-12 NOTE — ED Triage Notes (Signed)
Patient reports right flank pain that started several days ago, worse this am. States that he has hx of kidney stone and pain is similar. Endorses nausea and chills.  ?

## 2021-08-15 ENCOUNTER — Telehealth: Payer: Self-pay

## 2021-08-15 NOTE — Telephone Encounter (Signed)
Transition Care Management Follow-up Telephone Call ?Date of discharge and from where: 08/12/2021 from [facility] ?How have you been since you were released from the hospital? Patient stated that he is feeling well and did not have any questions or concerns at this time. Patient stated that he had a referral for urology and did not follow up. I did recommend following up to determine if there are steps to prevent. Patient stated understanding.  ?Any questions or concerns? No ? ?Items Reviewed: ?Did the pt receive and understand the discharge instructions provided? Yes  ?Medications obtained and verified? Yes  ?Other? No  ?Any new allergies since your discharge? No  ?Dietary orders reviewed? No ?Do you have support at home? Yes  ? ? ?Functional Questionnaire: (I = Independent and D = Dependent) ?ADLs: I ? ?Bathing/Dressing- I ? ?Meal Prep- I ? ?Eating- I ? ?Maintaining continence- I ? ?Transferring/Ambulation- I ? ?Managing Meds- I ? ? ?Follow up appointments reviewed: ? ?PCP Hospital f/u appt confirmed? No   ?Specialist Hospital f/u appt confirmed? No  Patient  ?Are transportation arrangements needed? No  ?If their condition worsens, is the pt aware to call PCP or go to the Emergency Dept.? Yes ?Was the patient provided with contact information for the PCP's office or ED? Yes ?Was to pt encouraged to call back with questions or concerns? Yes ? ?

## 2021-12-09 ENCOUNTER — Other Ambulatory Visit: Payer: Self-pay | Admitting: Family Medicine

## 2021-12-09 DIAGNOSIS — I10 Essential (primary) hypertension: Secondary | ICD-10-CM

## 2021-12-14 ENCOUNTER — Ambulatory Visit: Payer: Medicaid Other | Admitting: Family Medicine

## 2022-03-16 ENCOUNTER — Other Ambulatory Visit: Payer: Self-pay | Admitting: Family Medicine

## 2022-03-16 DIAGNOSIS — I10 Essential (primary) hypertension: Secondary | ICD-10-CM

## 2022-04-12 ENCOUNTER — Encounter: Payer: Self-pay | Admitting: *Deleted

## 2022-07-06 IMAGING — CT CT RENAL STONE PROTOCOL
2 of 4 series · 16 of 46 positions shown, 18 images · non-contrast
Comparison: None.

CLINICAL DATA: Nephrolithiasis.  Left flank pain

EXAM:
CT ABDOMEN AND PELVIS WITHOUT CONTRAST
TECHNIQUE: Multidetector CT imaging of the abdomen and pelvis was performed
following the standard protocol without IV contrast.

[Series 2: axial st · axial · 0.82mm/px · z∈[+647,+1102]mm · 13 of 105 slices shown, 15 images]
[im 7/105  soft-tissue]
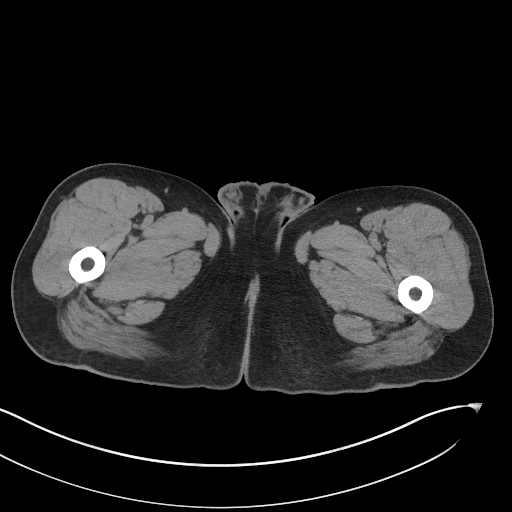
[im 7/105  bone]
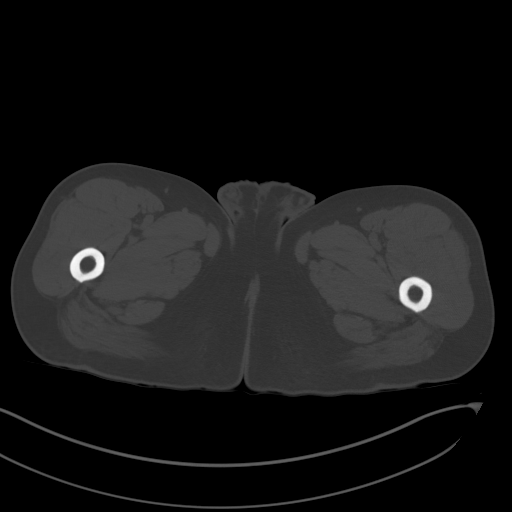
[im 14/105  soft-tissue]
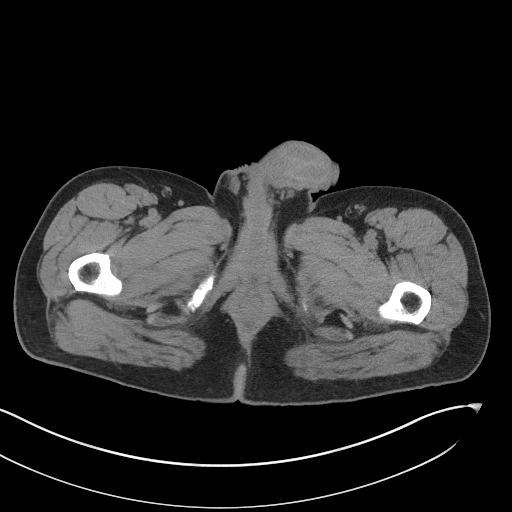
[im 20/105  soft-tissue]
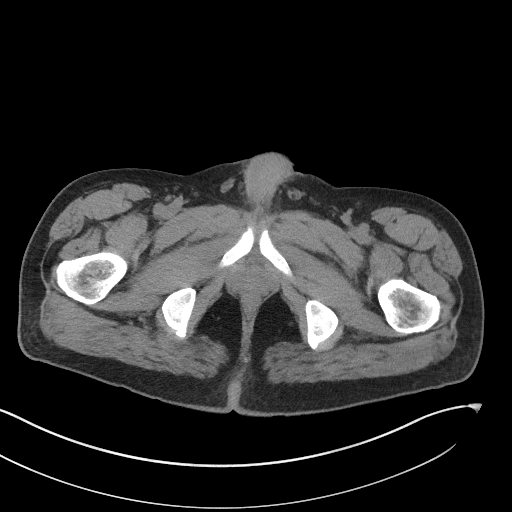
[im 33/105  soft-tissue]
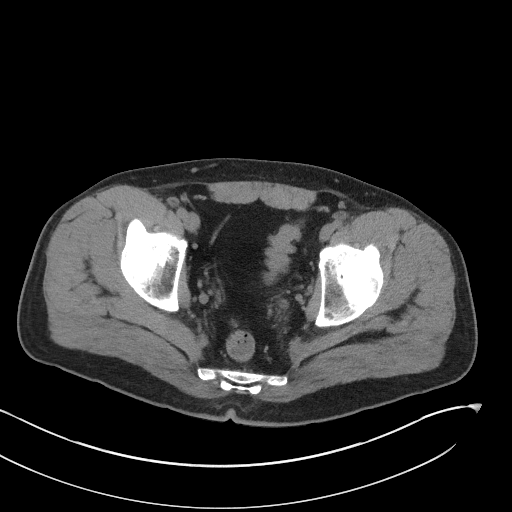
[im 40/105  soft-tissue]
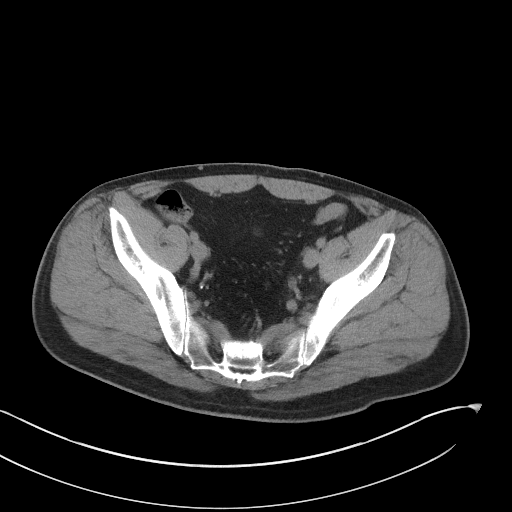
[im 46/105  soft-tissue]
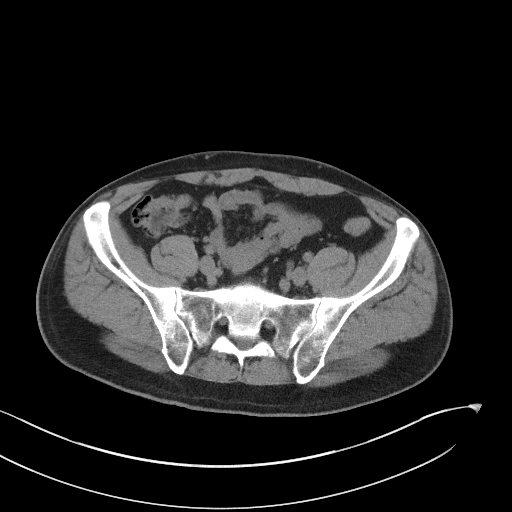
[im 53/105  soft-tissue]
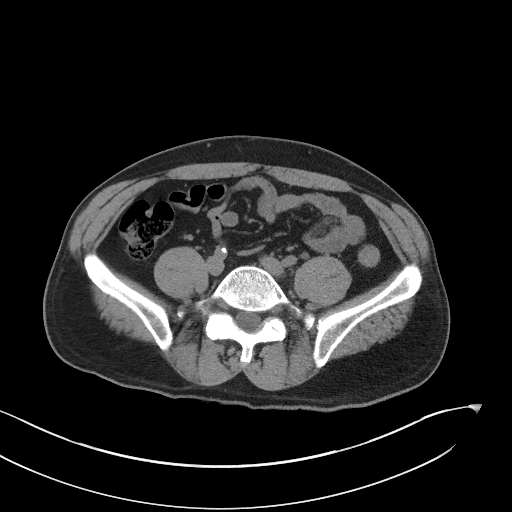
[im 59/105  soft-tissue]
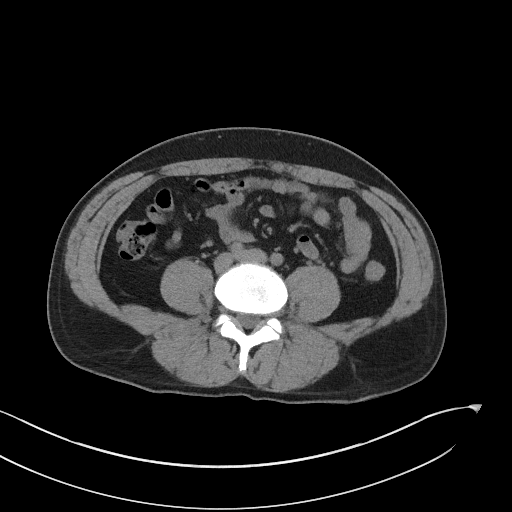
[im 66/105  soft-tissue]
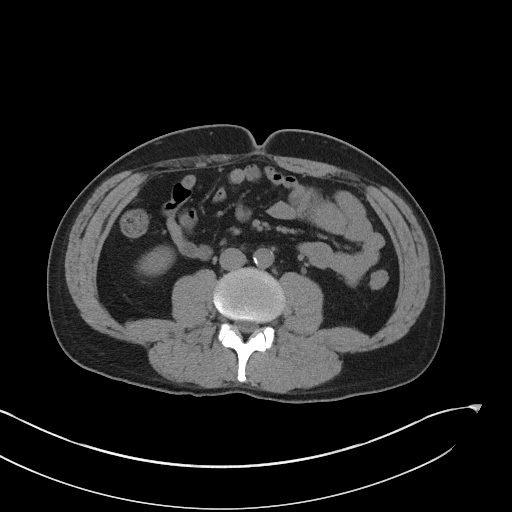
[im 66/105  bone]
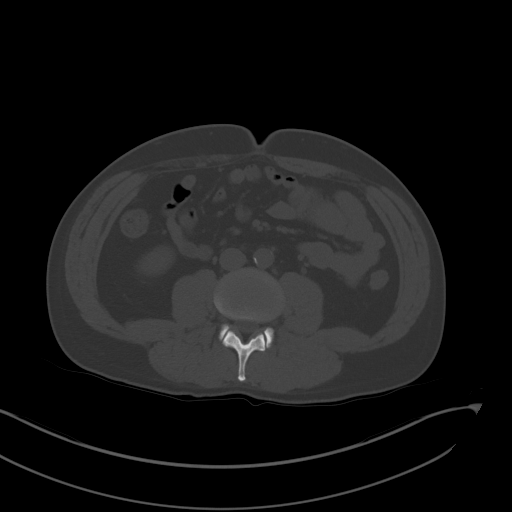
[im 72/105  soft-tissue]
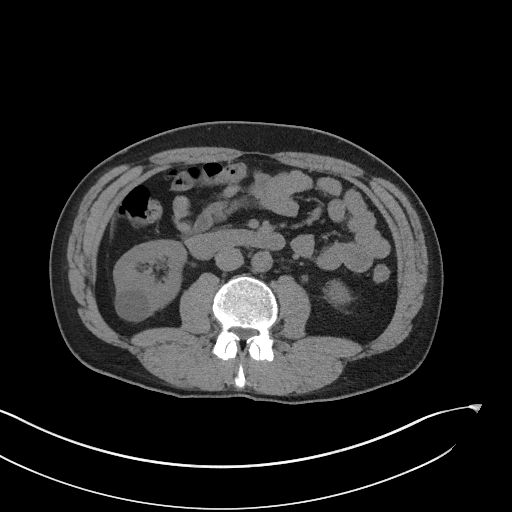
[im 85/105  soft-tissue]
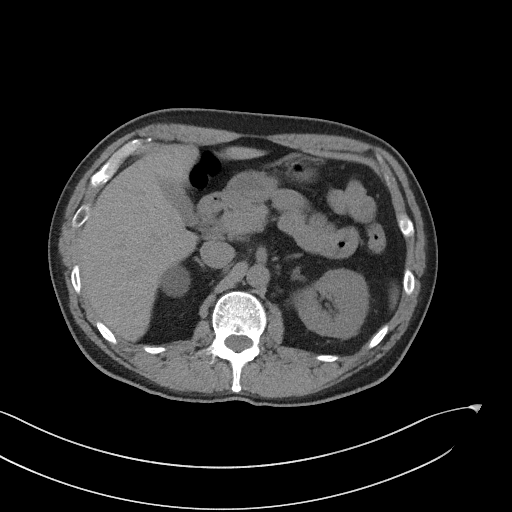
[im 92/105  soft-tissue]
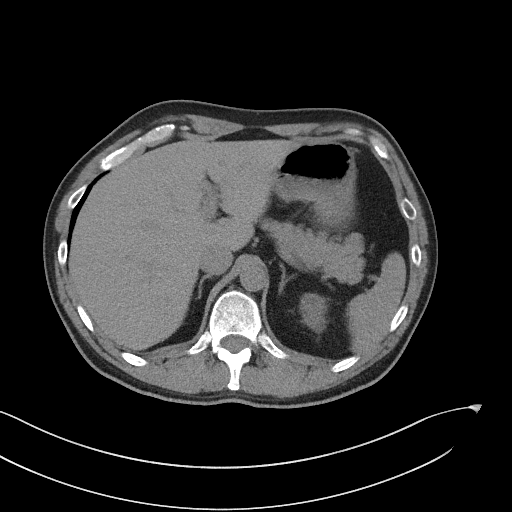
[im 98/105  soft-tissue]
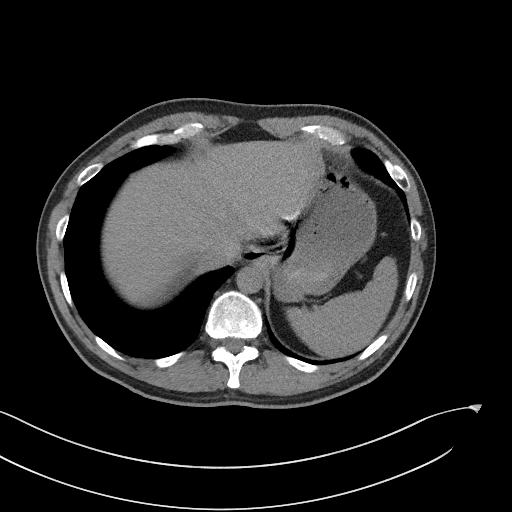

[Series 5: coronal st · coronal · 0.90mm/px · 3 of 94 slices shown]
[im 32/94  soft-tissue]
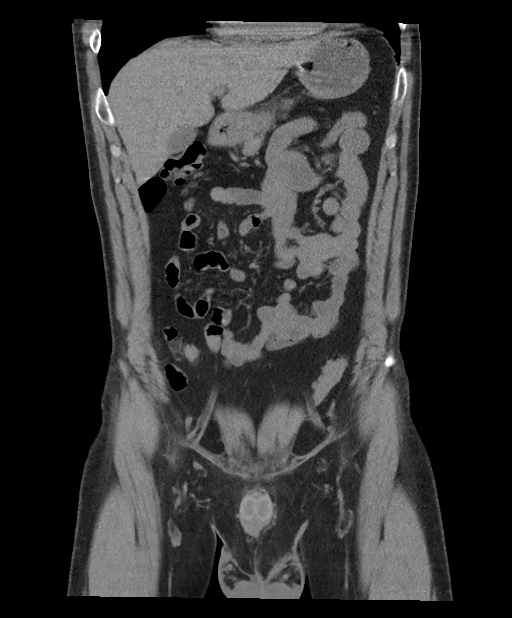
[im 42/94  soft-tissue]
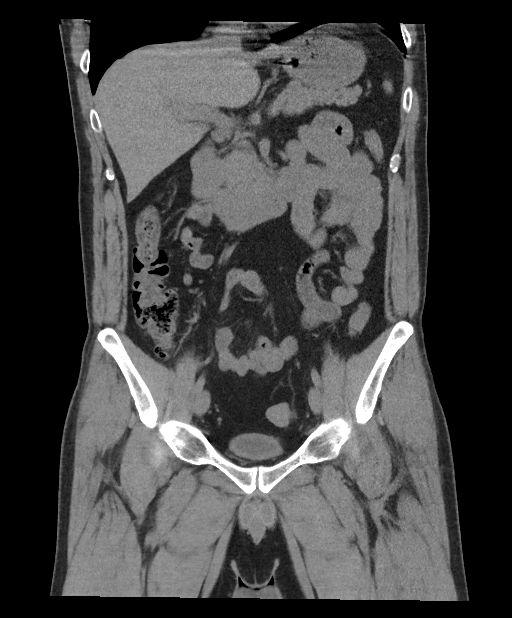
[im 52/94  soft-tissue]
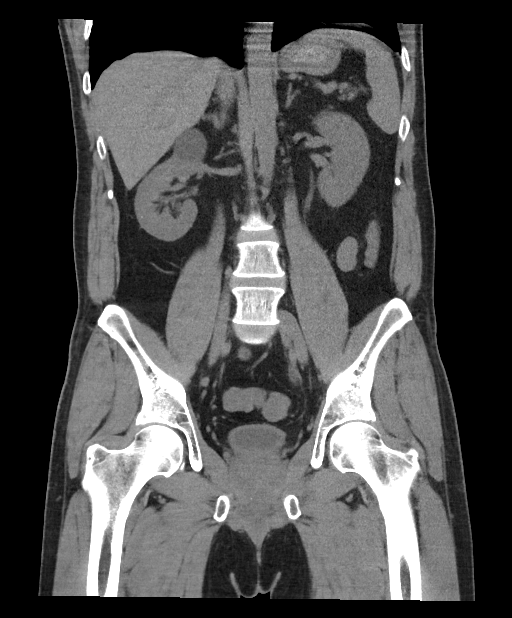

[16 of 46 positions shown; findings below may reference images not displayed]

FINDINGS: Lower chest: No acute abnormality.

Hepatobiliary: No focal hepatic abnormality. Gallbladder
unremarkable.

Pancreas: No focal abnormality or ductal dilatation.

Spleen: No focal abnormality.  Normal size.

Adrenals/Urinary Tract: Punctate nonobstructing stones in the mid
and lower pole of the right kidney. No left ureteral stones. No
hydronephrosis. There is 3 mm distal left ureteral stone at the left
UVJ, nonobstructing. Bilateral low-density lesions in the kidneys,
likely cysts although difficult to characterize on this noncontrast
study. Adrenal glands and urinary bladder unremarkable.

Stomach/Bowel: Normal appendix. Stomach, large and small bowel
grossly unremarkable.

Vascular/Lymphatic: Scattered aortic calcifications. No evidence of
aneurysm or adenopathy.

Reproductive: No visible focal abnormality.

Other: No free fluid or free air.

Musculoskeletal: No acute bony abnormality.
IMPRESSION: 3 mm distal left ureteral stone.  No hydronephrosis.

Right nephrolithiasis.

Low-density lesions in the kidneys are difficult to characterize
without intravenous contrast but likely cysts.

Aortic atherosclerosis.

## 2022-07-10 ENCOUNTER — Other Ambulatory Visit: Payer: Self-pay | Admitting: Family Medicine

## 2022-07-10 DIAGNOSIS — I10 Essential (primary) hypertension: Secondary | ICD-10-CM

## 2022-07-10 MED ORDER — LOSARTAN POTASSIUM 50 MG PO TABS: 50.0000 mg | ORAL_TABLET | Freq: Every day | ORAL | 0 refills | Status: DC

## 2022-07-10 NOTE — Telephone Encounter (Signed)
Caller name: TEYLOR MANDERS  On DPR?: Yes  Call back number: [phone] (mobile)  Provider they see: Wendie Agreste, MD  Reason for call: Pt is in between insurances until June 2024. Can't afford office visit. . But would his Blood pressure filled -advise

## 2022-07-10 NOTE — Telephone Encounter (Signed)
Planned 28-monthfollow-up at his February 2023 visit.  Overdue for follow-up.  Temporary refill provided but please call and schedule appointment for medication review.

## 2022-07-10 NOTE — Telephone Encounter (Signed)
Pts has appt. 07/20/2022

## 2022-07-10 NOTE — Telephone Encounter (Signed)
Patient is requesting a refill of the following medications: Requested Prescriptions   Pending Prescriptions Disp Refills   losartan (COZAAR) 50 MG tablet 90 tablet 0    Sig: Take 1 tablet (50 mg total) by mouth daily.    Date of patient request: 07/10/22 Last office visit: 06/17/2021 Date of last refill: 03/17/2022 Last refill amount: 90 Follow up time period per chart: 6 months (12/15/2021)

## 2022-07-12 NOTE — Telephone Encounter (Deleted)
error 

## 2022-07-20 ENCOUNTER — Ambulatory Visit (INDEPENDENT_AMBULATORY_CARE_PROVIDER_SITE_OTHER): Payer: Self-pay | Admitting: Family Medicine

## 2022-07-20 ENCOUNTER — Encounter: Payer: Self-pay | Admitting: Family Medicine

## 2022-07-20 VITALS — BP 148/88 | HR 65 | Temp 98.7°F | Ht 74.0 in | Wt 163.8 lb

## 2022-07-20 DIAGNOSIS — F439 Reaction to severe stress, unspecified: Secondary | ICD-10-CM

## 2022-07-20 DIAGNOSIS — R002 Palpitations: Secondary | ICD-10-CM

## 2022-07-20 DIAGNOSIS — I10 Essential (primary) hypertension: Secondary | ICD-10-CM

## 2022-07-20 MED ORDER — LOSARTAN POTASSIUM 100 MG PO TABS: 100.0000 mg | ORAL_TABLET | Freq: Every day | ORAL | 5 refills | Status: DC

## 2022-07-20 NOTE — Progress Notes (Signed)
Subjective:  Patient ID: Curtis Snow, male    DOB: 30-Jul-1975  Age: 47 y.o. MRN: [id number]  CC:  Chief Complaint  Patient presents with   Hypertension    Pt states he stopped medication and bp went up but continued and bp went back to normal    HPI Curtis Snow presents for   Hypertension: Treated with losartan 50 mg daily.  Last visit with me in February 2023.  I discussed elevated blood pressures at his January 2023 visit.  Handout given on management of hypertension, continue same regimen at that time.  Prior palpitations have improved.  No acute findings on EKG, TSH and CBC were reassuring.  Palpitations had improved with less of tobacco and soda.  22-month follow-up planned.   Some challenges in life. Going through separation beginning of February, but still living in same home. Difficult being in same home. Stress with 47 yo - talking about quitting school - out for past month - plans on possible GED. Concerned about stress affecting blood pressure. Some palpitations again. Smoking has increased again. Some foods cause palpitations. Slight missed beat at times, then harder beat.  No chest pains.  No alcohol. Occasional marijuana, no IDU otherwise.  Exercise past week or two - low intensity. Feels better.  No current therapist - inbetween insurance now - possible insurance through work in 3 months.  Stopped BP med a few weeks ago - 4-5 days, then headache with BP 180/110. restarted. Taking daily now. No recent missed doses, No new med side effects.  Caffeine - 3  of the 20oz sodas per day. Has cut back as of today.   Home readings: BP Readings from Last 3 Encounters:  07/20/22 (!) 148/88  08/12/21 (!) 136/99  06/17/21 124/68   Lab Results  Component Value Date   CREATININE 0.97 08/12/2021     History Patient Active Problem List   Diagnosis Date Noted   HTN (hypertension) 11/18/2019   Rectal bleeding 05/13/2019   Smoking    Past Medical History:   Diagnosis Date   Anxiety    DDD (degenerative disc disease)    saw Dr. Ace Gins   Hypertension    Smoking    Past Surgical History:  Procedure Laterality Date   COLONOSCOPY N/A 05/20/2019   Procedure: COLONOSCOPY;  Surgeon: Daneil Dolin, MD;  Location: [facility];  Service: Endoscopy;  Laterality: N/A;  1:30pm   NO PAST SURGERIES     POLYPECTOMY  05/20/2019   Procedure: POLYPECTOMY;  Surgeon: Daneil Dolin, MD;  Location: [facility];  Service: Endoscopy;;  hepatic flexure   Allergies  Allergen Reactions   Penicillins Shortness Of Breath, Nausea And Vomiting and Rash    Did it involve swelling of the face/tongue/throat, SOB, or low BP? Yes Did it involve sudden or severe rash/hives, skin peeling, or any reaction on the inside of your mouth or nose? Yes Did you need to seek medical attention at a hospital or doctor's office? Yes When did it last happen?      childhood allergy If all above answers are "NO", may proceed with cephalosporin use.    Prior to Admission medications   Medication Sig Start Date End Date Taking? Authorizing Provider  losartan (COZAAR) 50 MG tablet Take 1 tablet (50 mg total) by mouth daily. 07/10/22  Yes Wendie Agreste, MD  HYDROcodone-acetaminophen (NORCO/VICODIN) 5-325 MG tablet Take 1 tablet by mouth every 6 (six) hours as needed for moderate pain. Patient  not taking: Reported on 07/20/2022 08/12/21   Fredia Sorrow, MD  naproxen (NAPROSYN) 500 MG tablet Take 1 tablet (500 mg total) by mouth 2 (two) times daily. Patient not taking: Reported on 07/20/2022 08/12/21   Fredia Sorrow, MD  ondansetron (ZOFRAN-ODT) 4 MG disintegrating tablet Take 1 tablet (4 mg total) by mouth every 8 (eight) hours as needed for nausea or vomiting. Patient not taking: Reported on 07/20/2022 08/12/21   Fredia Sorrow, MD  tamsulosin (FLOMAX) 0.4 MG CAPS capsule Take 1 capsule (0.4 mg total) by mouth daily. Patient not taking: Reported on 08/12/2021 05/08/21   Milton Ferguson, MD   Social History   Socioeconomic History   Marital status: Married    Spouse name: Not on file   Number of children: Not on file   Years of education: Not on file   Highest education level: Not on file  Occupational History   Occupation: welder  Tobacco Use   Smoking status: Every Day    Packs/day: 1    Types: Cigarettes   Smokeless tobacco: Never   Tobacco comments:    occ vapes  Substance and Sexual Activity   Alcohol use: No    Comment: rare   Drug use: No   Sexual activity: Yes    Birth control/protection: None  Other Topics Concern   Not on file  Social History Narrative   Married since 2014.Unemployed.Lives with wife and son.   Social Determinants of Health   Financial Resource Strain: Not on file  Food Insecurity: Not on file  Transportation Needs: Not on file  Physical Activity: Not on file  Stress: Not on file  Social Connections: Not on file  Intimate Partner Violence: Not on file    Review of Systems  Per HPI.  Objective:   Vitals:   07/20/22 1558 07/20/22 1606  BP: (!) 156/90 (!) 148/88  Pulse: 65   Temp: 98.7 F (37.1 C)   TempSrc: Temporal   SpO2: 97%   Weight: 163 lb 12.8 oz (74.3 kg)   Height: 6\' 2"  (1.88 m)    Physical Exam Vitals reviewed.  Constitutional:      Appearance: He is well-developed.  HENT:     Head: Normocephalic and atraumatic.  Neck:     Vascular: No carotid bruit or JVD.  Cardiovascular:     Rate and Rhythm: Normal rate and regular rhythm.     Heart sounds: Normal heart sounds. No murmur heard. Pulmonary:     Effort: Pulmonary effort is normal.     Breath sounds: Normal breath sounds. No rales.  Musculoskeletal:     Right lower leg: No edema.     Left lower leg: No edema.  Skin:    General: Skin is warm and dry.  Neurological:     Mental Status: He is alert and oriented to person, place, and time.  Psychiatric:        Mood and Affect: Mood normal.      Assessment & Plan:  Curtis Snow is a 47 y.o. male . Essential hypertension - Plan: losartan (COZAAR) 100 MG tablet, Basic metabolic panel, CBC  -Uncontrolled.  Few palpitations as below but otherwise asymptomatic.  Higher dose losartan ordered, check screening labs.  Lab visit planned next week.  Situational stress  -With stressors as above.  Handout given on management of stress, option of counseling but deferred at this time.  Recheck 3 months, sooner if needed.  MyChart message if he does request counseling and  can provide some options locally.  Low intensity exercise has been helpful, other coping techniques discussed with handout as above.  Palpitations - Plan: CBC, TSH  -Intermittent, similar in past.  Suspect component of stress, caffeine, restarted smoking.  Decreased caffeine recommended, he has already cut back as of today.  Also plans on decreasing smoking.  Check CBC, TSH, RTC precautions if persistent.  Stress management as above.  ER precautions.  28-month follow-up  Meds ordered this encounter  Medications   losartan (COZAAR) 100 MG tablet    Sig: Take 1 tablet (100 mg total) by mouth daily.    Dispense:  30 tablet    Refill:  5   Patient Instructions  Decrease caffeine intake.  Increase losartan to 100mg  per day.  Decrease smoking as able.  See info on palpitations and stress management.  Have labs done next week.  If you would like to meet with a therapist, let me know.  Hang in there.   Managing Stress, Adult Feeling a certain amount of stress is normal. Stress helps our body and mind get ready to deal with the demands of life. Stress hormones can motivate you to do well at work and meet your responsibilities. But severe or long-term (chronic) stress can affect your mental and physical health. Chronic stress puts you at higher risk for: Anxiety and depression. Other health problems such as digestive problems, muscle aches, heart disease, high blood pressure, and stroke. What are the  causes? Common causes of stress include: Demands from work, such as deadlines, feeling overworked, or having long hours. Pressures at home, such as money issues, disagreements with a spouse, or parenting issues. Pressures from major life changes, such as divorce, moving, loss of a loved one, or chronic illness. You may be at higher risk for stress-related problems if you: Do not get enough sleep. Are in poor health. Do not have emotional support. Have a mental health disorder such as anxiety or depression. How to recognize stress Stress can make you: Have trouble sleeping. Feel sad, anxious, irritable, or overwhelmed. Lose your appetite. Overeat or want to eat unhealthy foods. Want to use drugs or alcohol. Stress can also cause physical symptoms, such as: Sore, tense muscles, especially in the shoulders and neck. Headaches. Trouble breathing. A faster heart rate. Stomach pain, nausea, or vomiting. Diarrhea or constipation. Trouble concentrating. Follow these instructions at home: Eating and drinking Eat a healthy diet. This includes: Eating foods that are high in fiber, such as beans, whole grains, and fresh fruits and vegetables. Limiting foods that are high in fat and processed sugars, such as fried or sweet foods. Do not skip meals or overeat. Drink enough fluid to keep your urine pale yellow. Alcohol use Do not drink alcohol if: Your health care provider tells you not to drink. You are pregnant, may be pregnant, or are planning to become pregnant. Drinking alcohol is a way some people try to ease their stress. This can be dangerous, so if you drink alcohol: Limit how much you have to: 0-1 drink a day for women. 0-2 drinks a day for men. Know how much alcohol is in your drink. In the [place] one drink equals one 12 oz bottle of beer (355 mL), one 5 oz glass of wine (148 mL), or one 1 oz glass of hard liquor (44 mL). Activity  Include 30 minutes of exercise in your daily  schedule. Exercise is a good stress reducer. Include time in your day for an activity that  you find relaxing. Try taking a walk, going on a bike ride, reading a book, or listening to music. Schedule your time in a way that lowers stress, and keep a regular schedule. Focus on doing what is most important to get done. Lifestyle Identify the source of your stress and your reaction to it. See a therapist who can help you change unhelpful reactions. When there are stressful events: Talk about them with family, friends, or coworkers. Try to think realistically about stressful events and not ignore them or overreact. Try to find the positives in a stressful situation and not focus on the negatives. Cut back on responsibilities at work and home, if possible. Ask for help from friends or family members if you need it. Find ways to manage stress, such as: Mindfulness, meditation, or deep breathing. Yoga or tai chi. Progressive muscle relaxation. Spending time in nature. Doing art, playing music, or reading. Making time for fun activities. Spending time with family and friends. Get support from family, friends, or spiritual resources. General instructions Get enough sleep. Try to go to sleep and get up at about the same time every day. Take over-the-counter and prescription medicines only as told by your health care provider. Do not use any products that contain nicotine or tobacco. These products include cigarettes, chewing tobacco, and vaping devices, such as e-cigarettes. If you need help quitting, ask your health care provider. Do not use drugs or smoke to deal with stress. Keep all follow-up visits. This is important. Where to find support Talk with your health care provider about stress management or finding a support group. Find a therapist to work with you on your stress management techniques. Where to find more information [employer] on Mental Illness: www.nami.org American  Psychological Association: TVStereos.ch Contact a health care provider if: Your stress symptoms get worse. You are unable to manage your stress at home. You are struggling to stop using drugs or alcohol. Get help right away if: You may be a danger to yourself or others. You have any thoughts of death or suicide. Get help right awayif you feel like you may hurt yourself or others, or have thoughts about taking your own life. Go to your nearest emergency room or: Call 911. Call the [facility] at (403)161-8176 or 988 in the [place] This is open 24 hours a day. Text the Crisis Text Line at 959-404-4488. Summary Feeling a certain amount of stress is normal, but severe or long-term (chronic) stress can affect your mental and physical health. Chronic stress can put you at higher risk for anxiety, depression, and other health problems such as digestive problems, muscle aches, heart disease, high blood pressure, and stroke. You may be at higher risk for stress-related problems if you do not get enough sleep, are in poor health, lack emotional support, or have a mental health disorder such as anxiety or depression. Identify the source of your stress and your reaction to it. Try talking about stressful events with family, friends, or coworkers, finding a coping method, or getting support from spiritual resources. If you need more help, talk with your health care provider about finding a support group or a mental health therapist. This information is not intended to replace advice given to you by your health care provider. Make sure you discuss any questions you have with your health care provider. Document Revised: 11/11/2020 Document Reviewed: 11/09/2020 Elsevier Patient Education  [place].   Palpitations Palpitations are feelings that your heartbeat is  irregular or is faster than normal. It may feel like your heart is fluttering or skipping a beat. Palpitations may be  caused by many things, including smoking, caffeine, alcohol, stress, and certain medicines or drugs. Most causes of palpitations are not serious.  However, some palpitations can be a sign of a serious problem. Further tests and a thorough medical history will be done to find the cause of your palpitations. Your provider may order tests such as an ECG, labs, an echocardiogram, or an ambulatory continuous ECG monitor. Follow these instructions at home: Pay attention to any changes in your symptoms. Let your health care provider know about them. Take these actions to help manage your symptoms: Eating and drinking Follow instructions from your health care provider about eating or drinking restrictions. You may need to avoid foods and drinks that may cause palpitations. These may include: Caffeinated coffee, tea, soft drinks, and energy drinks. Chocolate. Alcohol. Diet pills. Lifestyle     Take steps to reduce your stress and anxiety. Things that can help you relax include: Yoga. Mind-body activities, such as deep breathing, meditation, or using words and images to create positive thoughts (guided imagery). Physical activity, such as swimming, jogging, or walking. Tell your health care provider if your palpitations increase with activity. If you have chest pain or shortness of breath with activity, do not continue the activity until you are seen by your health care provider. Biofeedback. This is a method that helps you learn to use your mind to control things in your body, such as your heartbeat. Get plenty of rest and sleep. Keep a regular bed time. Do not use drugs, including cocaine or ecstasy. Do not use marijuana. Do not use any products that contain nicotine or tobacco. These products include cigarettes, chewing tobacco, and vaping devices, such as e-cigarettes. If you need help quitting, ask your health care provider. General instructions Take over-the-counter and prescription medicines only  as told by your health care provider. Keep all follow-up visits. This is important. These may include visits for further testing if palpitations do not go away or get worse. Contact a health care provider if: You continue to have a fast or irregular heartbeat for a long period of time. You notice that your palpitations occur more often. Get help right away if: You have chest pain or shortness of breath. You have a severe headache. You feel dizzy or you faint. These symptoms may represent a serious problem that is an emergency. Do not wait to see if the symptoms will go away. Get medical help right away. Call your local emergency services (911 in the [place] Do not drive yourself to the hospital. Summary Palpitations are feelings that your heartbeat is irregular or is faster than normal. It may feel like your heart is fluttering or skipping a beat. Palpitations may be caused by many things, including smoking, caffeine, alcohol, stress, certain medicines, and drugs. Further tests and a thorough medical history may be done to find the cause of your palpitations. Get help right away if you faint or have chest pain, shortness of breath, severe headache, or dizziness. This information is not intended to replace advice given to you by your health care provider. Make sure you discuss any questions you have with your health care provider. Document Revised: 09/08/2020 Document Reviewed: 09/08/2020 Elsevier Patient Education  [place] Your Hypertension Hypertension, also called high blood pressure, is when the force of the blood pressing against the walls of the  arteries is too strong. Arteries are blood vessels that carry blood from your heart throughout your body. Hypertension forces the heart to work harder to pump blood and may cause the arteries to become narrow or stiff. Understanding blood pressure readings A blood pressure reading includes a higher number over a lower  number: The first, or top, number is called the systolic pressure. It is a measure of the pressure in your arteries as your heart beats. The second, or bottom number, is called the diastolic pressure. It is a measure of the pressure in your arteries as the heart relaxes. For most people, a normal blood pressure is below 120/80. Your personal target blood pressure may vary depending on your medical conditions, your age, and other factors. Blood pressure is classified into four stages. Based on your blood pressure reading, your health care provider may use the following stages to determine what type of treatment you need, if any. Systolic pressure and diastolic pressure are measured in a unit called millimeters of mercury (mmHg). Normal Systolic pressure: below 123456. Diastolic pressure: below 80. Elevated Systolic pressure: [id number]. Diastolic pressure: below 80. Hypertension stage 1 Systolic pressure: 0000000. Diastolic pressure: [id number]. Hypertension stage 2 Systolic pressure: [id number] or above. Diastolic pressure: 90 or above. How can this condition affect me? Managing your hypertension is very important. Over time, hypertension can damage the arteries and decrease blood flow to parts of the body, including the brain, heart, and kidneys. Having untreated or uncontrolled hypertension can lead to: A heart attack. A stroke. A weakened blood vessel (aneurysm). Heart failure. Kidney damage. Eye damage. Memory and concentration problems. Vascular dementia. What actions can I take to manage this condition? Hypertension can be managed by making lifestyle changes and possibly by taking medicines. Your health care provider will help you make a plan to bring your blood pressure within a normal range. You may be referred for counseling on a healthy diet and physical activity. Nutrition  Eat a diet that is high in fiber and potassium, and low in salt (sodium), added sugar, and fat. An example eating plan  is called the DASH diet. DASH stands for Dietary Approaches to Stop Hypertension. To eat this way: Eat plenty of fresh fruits and vegetables. Try to fill one-half of your plate at each meal with fruits and vegetables. Eat whole grains, such as whole-wheat pasta, brown rice, or whole-grain bread. Fill about one-fourth of your plate with whole grains. Eat low-fat dairy products. Avoid fatty cuts of meat, processed or cured meats, and poultry with skin. Fill about one-fourth of your plate with lean proteins such as fish, chicken without skin, beans, eggs, and tofu. Avoid pre-made and processed foods. These tend to be higher in sodium, added sugar, and fat. Reduce your daily sodium intake. Many people with hypertension should eat less than 1,500 mg of sodium a day. Lifestyle  Work with your health care provider to maintain a healthy body weight or to lose weight. Ask what an ideal weight is for you. Get at least 30 minutes of exercise that causes your heart to beat faster (aerobic exercise) most days of the week. Activities may include walking, swimming, or biking. Include exercise to strengthen your muscles (resistance exercise), such as weight lifting, as part of your weekly exercise routine. Try to do these types of exercises for 30 minutes at least 3 days a week. Do not use any products that contain nicotine or tobacco. These products include cigarettes, chewing tobacco, and vaping devices, such as e-cigarettes.  If you need help quitting, ask your health care provider. Control any long-term (chronic) conditions you have, such as high cholesterol or diabetes. Identify your sources of stress and find ways to manage stress. This may include meditation, deep breathing, or making time for fun activities. Alcohol use Do not drink alcohol if: Your health care provider tells you not to drink. You are pregnant, may be pregnant, or are planning to become pregnant. If you drink alcohol: Limit how much you  have to: 0-1 drink a day for women. 0-2 drinks a day for men. Know how much alcohol is in your drink. In the [place] one drink equals one 12 oz bottle of beer (355 mL), one 5 oz glass of wine (148 mL), or one 1 oz glass of hard liquor (44 mL). Medicines Your health care provider may prescribe medicine if lifestyle changes are not enough to get your blood pressure under control and if: Your systolic blood pressure is 130 or higher. Your diastolic blood pressure is 80 or higher. Take medicines only as told by your health care provider. Follow the directions carefully. Blood pressure medicines must be taken as told by your health care provider. The medicine does not work as well when you skip doses. Skipping doses also puts you at risk for problems. Monitoring Before you monitor your blood pressure: Do not smoke, drink caffeinated beverages, or exercise within 30 minutes before taking a measurement. Use the bathroom and empty your bladder (urinate). Sit quietly for at least 5 minutes before taking measurements. Monitor your blood pressure at home as told by your health care provider. To do this: Sit with your back straight and supported. Place your feet flat on the floor. Do not cross your legs. Support your arm on a flat surface, such as a table. Make sure your upper arm is at heart level. Each time you measure, take two or three readings one minute apart and record the results. You may also need to have your blood pressure checked regularly by your health care provider. General information Talk with your health care provider about your diet, exercise habits, and other lifestyle factors that may be contributing to hypertension. Review all the medicines you take with your health care provider because there may be side effects or interactions. Keep all follow-up visits. Your health care provider can help you create and adjust your plan for managing your high blood pressure. Where to find more  information National Heart, Lung, and Blood Institute: https://wilson-eaton.com/ American Heart Association: www.heart.org Contact a health care provider if: You think you are having a reaction to medicines you have taken. You have repeated (recurrent) headaches. You feel dizzy. You have swelling in your ankles. You have trouble with your vision. Get help right away if: You develop a severe headache or confusion. You have unusual weakness or numbness, or you feel faint. You have severe pain in your chest or abdomen. You vomit repeatedly. You have trouble breathing. These symptoms may be an emergency. Get help right away. Call 911. Do not wait to see if the symptoms will go away. Do not drive yourself to the hospital. Summary Hypertension is when the force of blood pumping through your arteries is too strong. If this condition is not controlled, it may put you at risk for serious complications. Your personal target blood pressure may vary depending on your medical conditions, your age, and other factors. For most people, a normal blood pressure is less than 120/80. Hypertension is managed by lifestyle  changes, medicines, or both. Lifestyle changes to help manage hypertension include losing weight, eating a healthy, low-sodium diet, exercising more, stopping smoking, and limiting alcohol. This information is not intended to replace advice given to you by your health care provider. Make sure you discuss any questions you have with your health care provider. Document Revised: 12/30/2020 Document Reviewed: 12/30/2020 Elsevier Patient Education  [place],   Merri Ray, MD [place] 07/20/22 5:29 PM

## 2022-07-20 NOTE — Patient Instructions (Addendum)
Decrease caffeine intake.  Increase losartan to 100mg  per day.  Decrease smoking as able.  See info on palpitations and stress management.  Have labs done next week.  If you would like to meet with a therapist, let me know.  Hang in there.   Managing Stress, Adult Feeling a certain amount of stress is normal. Stress helps our body and mind get ready to deal with the demands of life. Stress hormones can motivate you to do well at work and meet your responsibilities. But severe or long-term (chronic) stress can affect your mental and physical health. Chronic stress puts you at higher risk for: Anxiety and depression. Other health problems such as digestive problems, muscle aches, heart disease, high blood pressure, and stroke. What are the causes? Common causes of stress include: Demands from work, such as deadlines, feeling overworked, or having long hours. Pressures at home, such as money issues, disagreements with a spouse, or parenting issues. Pressures from major life changes, such as divorce, moving, loss of a loved one, or chronic illness. You may be at higher risk for stress-related problems if you: Do not get enough sleep. Are in poor health. Do not have emotional support. Have a mental health disorder such as anxiety or depression. How to recognize stress Stress can make you: Have trouble sleeping. Feel sad, anxious, irritable, or overwhelmed. Lose your appetite. Overeat or want to eat unhealthy foods. Want to use drugs or alcohol. Stress can also cause physical symptoms, such as: Sore, tense muscles, especially in the shoulders and neck. Headaches. Trouble breathing. A faster heart rate. Stomach pain, nausea, or vomiting. Diarrhea or constipation. Trouble concentrating. Follow these instructions at home: Eating and drinking Eat a healthy diet. This includes: Eating foods that are high in fiber, such as beans, whole grains, and fresh fruits and vegetables. Limiting  foods that are high in fat and processed sugars, such as fried or sweet foods. Do not skip meals or overeat. Drink enough fluid to keep your urine pale yellow. Alcohol use Do not drink alcohol if: Your health care provider tells you not to drink. You are pregnant, may be pregnant, or are planning to become pregnant. Drinking alcohol is a way some people try to ease their stress. This can be dangerous, so if you drink alcohol: Limit how much you have to: 0-1 drink a day for women. 0-2 drinks a day for men. Know how much alcohol is in your drink. In the [place] one drink equals one 12 oz bottle of beer (355 mL), one 5 oz glass of wine (148 mL), or one 1 oz glass of hard liquor (44 mL). Activity  Include 30 minutes of exercise in your daily schedule. Exercise is a good stress reducer. Include time in your day for an activity that you find relaxing. Try taking a walk, going on a bike ride, reading a book, or listening to music. Schedule your time in a way that lowers stress, and keep a regular schedule. Focus on doing what is most important to get done. Lifestyle Identify the source of your stress and your reaction to it. See a therapist who can help you change unhelpful reactions. When there are stressful events: Talk about them with family, friends, or coworkers. Try to think realistically about stressful events and not ignore them or overreact. Try to find the positives in a stressful situation and not focus on the negatives. Cut back on responsibilities at work and home, if possible. Ask for help from friends or  family members if you need it. Find ways to manage stress, such as: Mindfulness, meditation, or deep breathing. Yoga or tai chi. Progressive muscle relaxation. Spending time in nature. Doing art, playing music, or reading. Making time for fun activities. Spending time with family and friends. Get support from family, friends, or spiritual resources. General instructions Get  enough sleep. Try to go to sleep and get up at about the same time every day. Take over-the-counter and prescription medicines only as told by your health care provider. Do not use any products that contain nicotine or tobacco. These products include cigarettes, chewing tobacco, and vaping devices, such as e-cigarettes. If you need help quitting, ask your health care provider. Do not use drugs or smoke to deal with stress. Keep all follow-up visits. This is important. Where to find support Talk with your health care provider about stress management or finding a support group. Find a therapist to work with you on your stress management techniques. Where to find more information [employer] on Mental Illness: www.nami.org American Psychological Association: TVStereos.ch Contact a health care provider if: Your stress symptoms get worse. You are unable to manage your stress at home. You are struggling to stop using drugs or alcohol. Get help right away if: You may be a danger to yourself or others. You have any thoughts of death or suicide. Get help right awayif you feel like you may hurt yourself or others, or have thoughts about taking your own life. Go to your nearest emergency room or: Call 911. Call the [place] at (310)673-4565 or 988 in the [place] This is open 24 hours a day. Text the Crisis Text Line at (718)198-6117. Summary Feeling a certain amount of stress is normal, but severe or long-term (chronic) stress can affect your mental and physical health. Chronic stress can put you at higher risk for anxiety, depression, and other health problems such as digestive problems, muscle aches, heart disease, high blood pressure, and stroke. You may be at higher risk for stress-related problems if you do not get enough sleep, are in poor health, lack emotional support, or have a mental health disorder such as anxiety or depression. Identify the source of your stress  and your reaction to it. Try talking about stressful events with family, friends, or coworkers, finding a coping method, or getting support from spiritual resources. If you need more help, talk with your health care provider about finding a support group or a mental health therapist. This information is not intended to replace advice given to you by your health care provider. Make sure you discuss any questions you have with your health care provider. Document Revised: 11/11/2020 Document Reviewed: 11/09/2020 Elsevier Patient Education  [place].   Palpitations Palpitations are feelings that your heartbeat is irregular or is faster than normal. It may feel like your heart is fluttering or skipping a beat. Palpitations may be caused by many things, including smoking, caffeine, alcohol, stress, and certain medicines or drugs. Most causes of palpitations are not serious.  However, some palpitations can be a sign of a serious problem. Further tests and a thorough medical history will be done to find the cause of your palpitations. Your provider may order tests such as an ECG, labs, an echocardiogram, or an ambulatory continuous ECG monitor. Follow these instructions at home: Pay attention to any changes in your symptoms. Let your health care provider know about them. Take these actions to help manage your symptoms: Eating and drinking  Follow instructions from your health care provider about eating or drinking restrictions. You may need to avoid foods and drinks that may cause palpitations. These may include: Caffeinated coffee, tea, soft drinks, and energy drinks. Chocolate. Alcohol. Diet pills. Lifestyle     Take steps to reduce your stress and anxiety. Things that can help you relax include: Yoga. Mind-body activities, such as deep breathing, meditation, or using words and images to create positive thoughts (guided imagery). Physical activity, such as swimming, jogging, or walking.  Tell your health care provider if your palpitations increase with activity. If you have chest pain or shortness of breath with activity, do not continue the activity until you are seen by your health care provider. Biofeedback. This is a method that helps you learn to use your mind to control things in your body, such as your heartbeat. Get plenty of rest and sleep. Keep a regular bed time. Do not use drugs, including cocaine or ecstasy. Do not use marijuana. Do not use any products that contain nicotine or tobacco. These products include cigarettes, chewing tobacco, and vaping devices, such as e-cigarettes. If you need help quitting, ask your health care provider. General instructions Take over-the-counter and prescription medicines only as told by your health care provider. Keep all follow-up visits. This is important. These may include visits for further testing if palpitations do not go away or get worse. Contact a health care provider if: You continue to have a fast or irregular heartbeat for a long period of time. You notice that your palpitations occur more often. Get help right away if: You have chest pain or shortness of breath. You have a severe headache. You feel dizzy or you faint. These symptoms may represent a serious problem that is an emergency. Do not wait to see if the symptoms will go away. Get medical help right away. Call your local emergency services (911 in the [place] Do not drive yourself to the hospital. Summary Palpitations are feelings that your heartbeat is irregular or is faster than normal. It may feel like your heart is fluttering or skipping a beat. Palpitations may be caused by many things, including smoking, caffeine, alcohol, stress, certain medicines, and drugs. Further tests and a thorough medical history may be done to find the cause of your palpitations. Get help right away if you faint or have chest pain, shortness of breath, severe headache, or  dizziness. This information is not intended to replace advice given to you by your health care provider. Make sure you discuss any questions you have with your health care provider. Document Revised: 09/08/2020 Document Reviewed: 09/08/2020 Elsevier Patient Education  [place] Your Hypertension Hypertension, also called high blood pressure, is when the force of the blood pressing against the walls of the arteries is too strong. Arteries are blood vessels that carry blood from your heart throughout your body. Hypertension forces the heart to work harder to pump blood and may cause the arteries to become narrow or stiff. Understanding blood pressure readings A blood pressure reading includes a higher number over a lower number: The first, or top, number is called the systolic pressure. It is a measure of the pressure in your arteries as your heart beats. The second, or bottom number, is called the diastolic pressure. It is a measure of the pressure in your arteries as the heart relaxes. For most people, a normal blood pressure is below 120/80. Your personal target blood pressure may vary depending on your  medical conditions, your age, and other factors. Blood pressure is classified into four stages. Based on your blood pressure reading, your health care provider may use the following stages to determine what type of treatment you need, if any. Systolic pressure and diastolic pressure are measured in a unit called millimeters of mercury (mmHg). Normal Systolic pressure: below 123456. Diastolic pressure: below 80. Elevated Systolic pressure: [id number]. Diastolic pressure: below 80. Hypertension stage 1 Systolic pressure: 0000000. Diastolic pressure: [id number]. Hypertension stage 2 Systolic pressure: [id number] or above. Diastolic pressure: 90 or above. How can this condition affect me? Managing your hypertension is very important. Over time, hypertension can damage the arteries and  decrease blood flow to parts of the body, including the brain, heart, and kidneys. Having untreated or uncontrolled hypertension can lead to: A heart attack. A stroke. A weakened blood vessel (aneurysm). Heart failure. Kidney damage. Eye damage. Memory and concentration problems. Vascular dementia. What actions can I take to manage this condition? Hypertension can be managed by making lifestyle changes and possibly by taking medicines. Your health care provider will help you make a plan to bring your blood pressure within a normal range. You may be referred for counseling on a healthy diet and physical activity. Nutrition  Eat a diet that is high in fiber and potassium, and low in salt (sodium), added sugar, and fat. An example eating plan is called the DASH diet. DASH stands for Dietary Approaches to Stop Hypertension. To eat this way: Eat plenty of fresh fruits and vegetables. Try to fill one-half of your plate at each meal with fruits and vegetables. Eat whole grains, such as whole-wheat pasta, brown rice, or whole-grain bread. Fill about one-fourth of your plate with whole grains. Eat low-fat dairy products. Avoid fatty cuts of meat, processed or cured meats, and poultry with skin. Fill about one-fourth of your plate with lean proteins such as fish, chicken without skin, beans, eggs, and tofu. Avoid pre-made and processed foods. These tend to be higher in sodium, added sugar, and fat. Reduce your daily sodium intake. Many people with hypertension should eat less than 1,500 mg of sodium a day. Lifestyle  Work with your health care provider to maintain a healthy body weight or to lose weight. Ask what an ideal weight is for you. Get at least 30 minutes of exercise that causes your heart to beat faster (aerobic exercise) most days of the week. Activities may include walking, swimming, or biking. Include exercise to strengthen your muscles (resistance exercise), such as weight lifting, as  part of your weekly exercise routine. Try to do these types of exercises for 30 minutes at least 3 days a week. Do not use any products that contain nicotine or tobacco. These products include cigarettes, chewing tobacco, and vaping devices, such as e-cigarettes. If you need help quitting, ask your health care provider. Control any long-term (chronic) conditions you have, such as high cholesterol or diabetes. Identify your sources of stress and find ways to manage stress. This may include meditation, deep breathing, or making time for fun activities. Alcohol use Do not drink alcohol if: Your health care provider tells you not to drink. You are pregnant, may be pregnant, or are planning to become pregnant. If you drink alcohol: Limit how much you have to: 0-1 drink a day for women. 0-2 drinks a day for men. Know how much alcohol is in your drink. In the [place] one drink equals one 12 oz bottle of beer (355 mL), one 5 oz  glass of wine (148 mL), or one 1 oz glass of hard liquor (44 mL). Medicines Your health care provider may prescribe medicine if lifestyle changes are not enough to get your blood pressure under control and if: Your systolic blood pressure is 130 or higher. Your diastolic blood pressure is 80 or higher. Take medicines only as told by your health care provider. Follow the directions carefully. Blood pressure medicines must be taken as told by your health care provider. The medicine does not work as well when you skip doses. Skipping doses also puts you at risk for problems. Monitoring Before you monitor your blood pressure: Do not smoke, drink caffeinated beverages, or exercise within 30 minutes before taking a measurement. Use the bathroom and empty your bladder (urinate). Sit quietly for at least 5 minutes before taking measurements. Monitor your blood pressure at home as told by your health care provider. To do this: Sit with your back straight and supported. Place your feet  flat on the floor. Do not cross your legs. Support your arm on a flat surface, such as a table. Make sure your upper arm is at heart level. Each time you measure, take two or three readings one minute apart and record the results. You may also need to have your blood pressure checked regularly by your health care provider. General information Talk with your health care provider about your diet, exercise habits, and other lifestyle factors that may be contributing to hypertension. Review all the medicines you take with your health care provider because there may be side effects or interactions. Keep all follow-up visits. Your health care provider can help you create and adjust your plan for managing your high blood pressure. Where to find more information National Heart, Lung, and Blood Institute: https://wilson-eaton.com/ American Heart Association: www.heart.org Contact a health care provider if: You think you are having a reaction to medicines you have taken. You have repeated (recurrent) headaches. You feel dizzy. You have swelling in your ankles. You have trouble with your vision. Get help right away if: You develop a severe headache or confusion. You have unusual weakness or numbness, or you feel faint. You have severe pain in your chest or abdomen. You vomit repeatedly. You have trouble breathing. These symptoms may be an emergency. Get help right away. Call 911. Do not wait to see if the symptoms will go away. Do not drive yourself to the hospital. Summary Hypertension is when the force of blood pumping through your arteries is too strong. If this condition is not controlled, it may put you at risk for serious complications. Your personal target blood pressure may vary depending on your medical conditions, your age, and other factors. For most people, a normal blood pressure is less than 120/80. Hypertension is managed by lifestyle changes, medicines, or both. Lifestyle changes to help  manage hypertension include losing weight, eating a healthy, low-sodium diet, exercising more, stopping smoking, and limiting alcohol. This information is not intended to replace advice given to you by your health care provider. Make sure you discuss any questions you have with your health care provider. Document Revised: 12/30/2020 Document Reviewed: 12/30/2020 Elsevier Patient Education  [place].

## 2022-07-27 ENCOUNTER — Other Ambulatory Visit: Payer: Self-pay

## 2022-07-27 LAB — BASIC METABOLIC PANEL
BUN: 12 mg/dL (ref 6–23)
CO2: 29 mEq/L (ref 19–32)
Calcium: 9 mg/dL (ref 8.4–10.5)
Chloride: 105 mEq/L (ref 96–112)
Creatinine, Ser: 0.94 mg/dL (ref 0.40–1.50)
GFR: 97.2 mL/min (ref >60.00)
Glucose, Bld: 80 mg/dL (ref 70–99)
Potassium: 3.9 mEq/L (ref 3.5–5.1)
Sodium: 140 mEq/L (ref 135–145)

## 2022-07-27 LAB — CBC
HCT: 46.3 % (ref 39.0–52.0)
Hemoglobin: 15.6 g/dL (ref 13.0–17.0)
MCHC: 33.7 g/dL (ref 30.0–36.0)
MCV: 87.8 fl (ref 78.0–100.0)
Platelets: 220 10*3/uL (ref 150.0–400.0)
RBC: 5.27 Mil/uL (ref 4.22–5.81)
RDW: 12.9 % (ref 11.5–15.5)
WBC: 6 10*3/uL (ref 4.0–10.5)

## 2022-07-27 LAB — TSH: TSH: 1.99 u[IU]/mL (ref 0.35–5.50)

## 2022-08-08 NOTE — Telephone Encounter (Signed)
Close please °

## 2022-10-10 IMAGING — CT CT RENAL STONE PROTOCOL
2 of 4 series · 16 of 46 positions shown, 18 images · non-contrast
Comparison: CT abdomen/pelvis 05/08/2021

CLINICAL DATA: Right flank pain for several days, nausea, chills
history of kidney stones



[Series 2: axial st · axial · 0.87mm/px · z∈[+1157,+1627]mm · 13 of 106 slices shown, 15 images]
[im 6/106  soft-tissue]
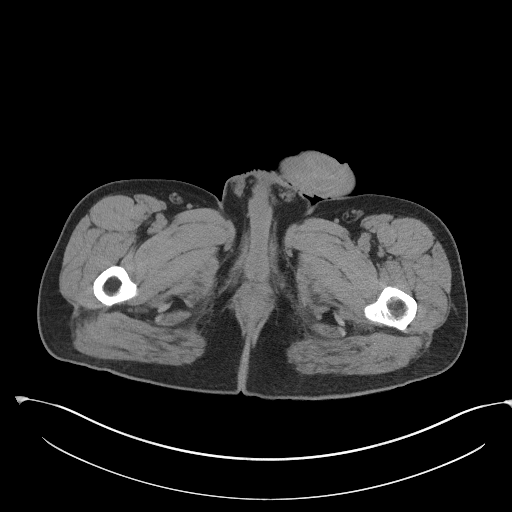
[im 6/106  bone]
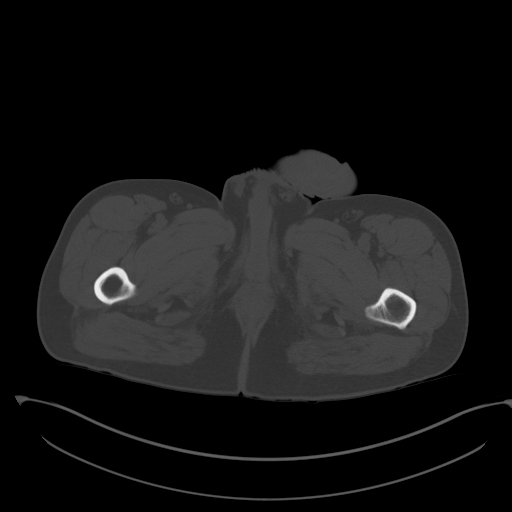
[im 17/106  soft-tissue]
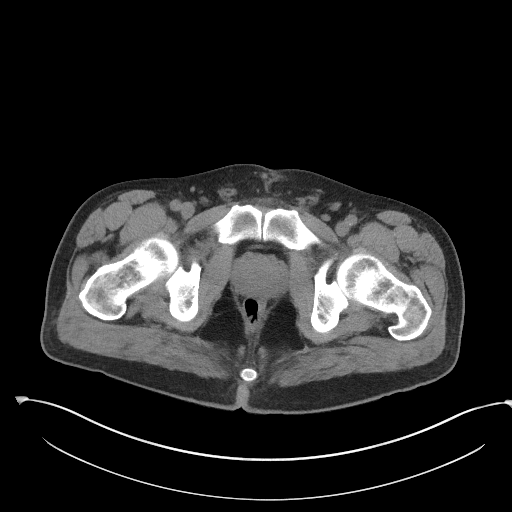
[im 23/106  soft-tissue]
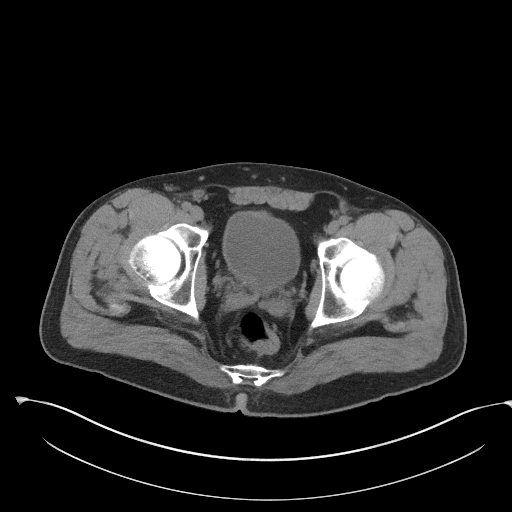
[im 28/106  soft-tissue]
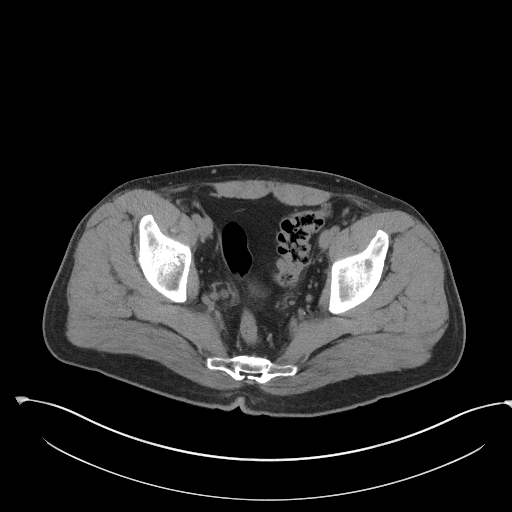
[im 39/106  soft-tissue]
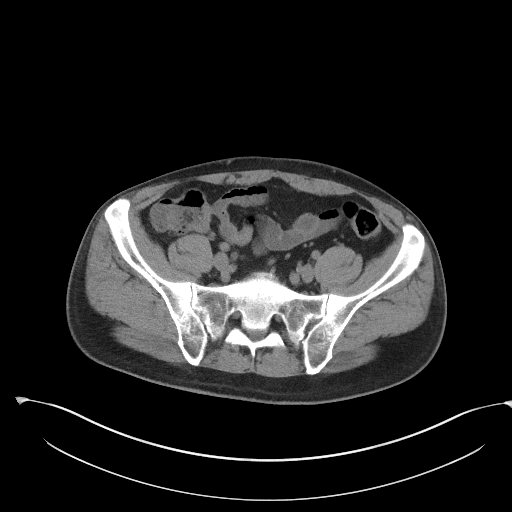
[im 45/106  soft-tissue]
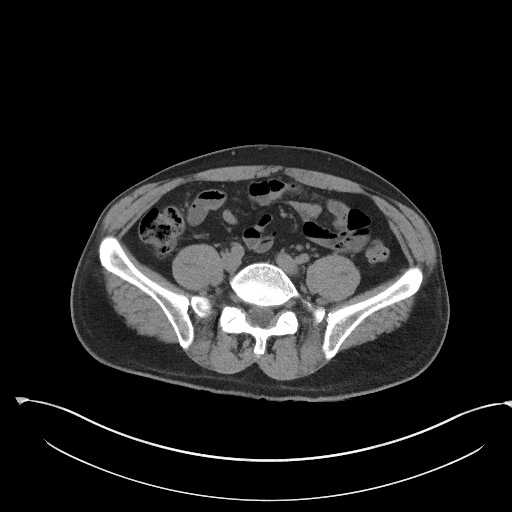
[im 56/106  soft-tissue]
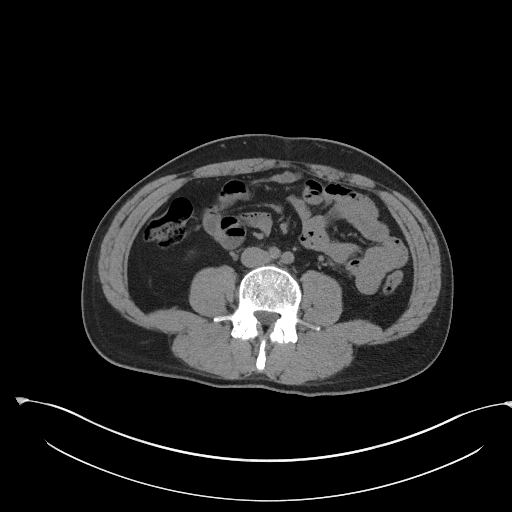
[im 61/106  soft-tissue]
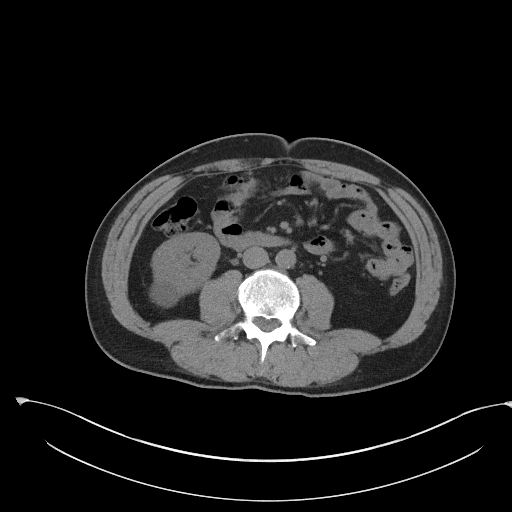
[im 67/106  soft-tissue]
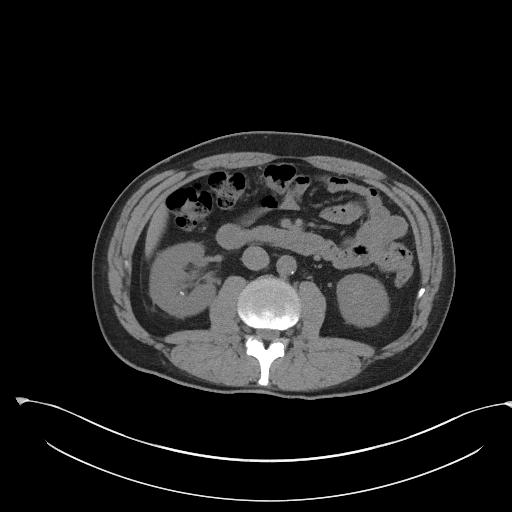
[im 67/106  bone]
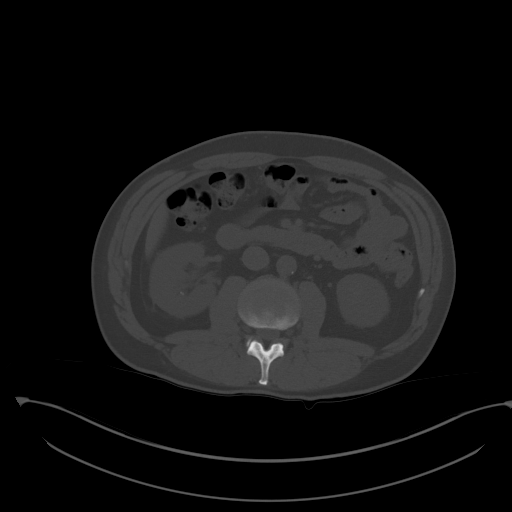
[im 78/106  soft-tissue]
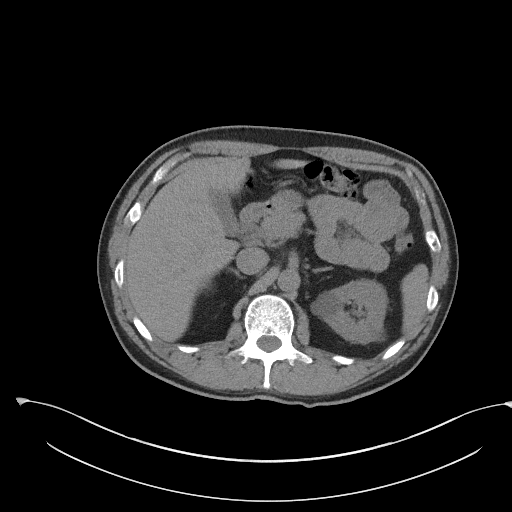
[im 83/106  soft-tissue]
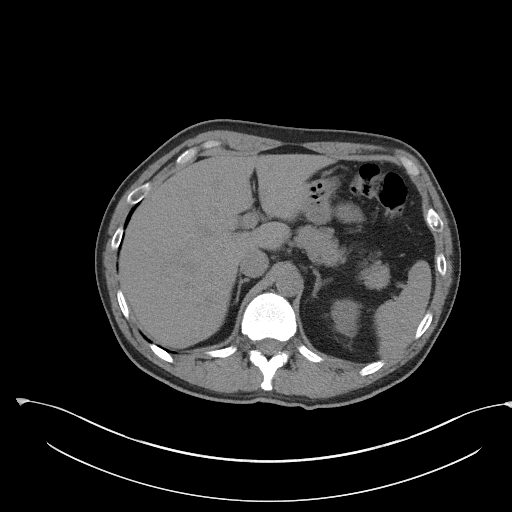
[im 89/106  soft-tissue]
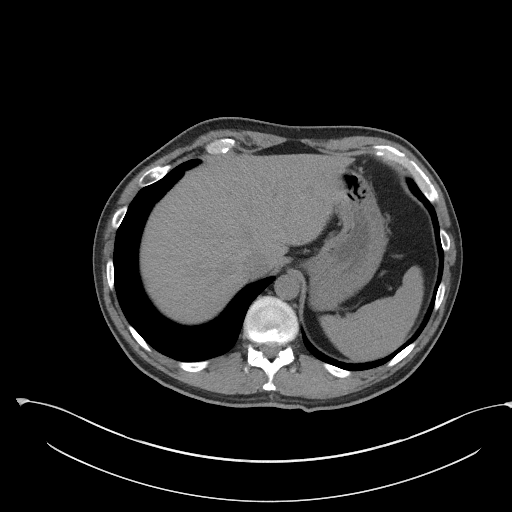
[im 100/106  soft-tissue]
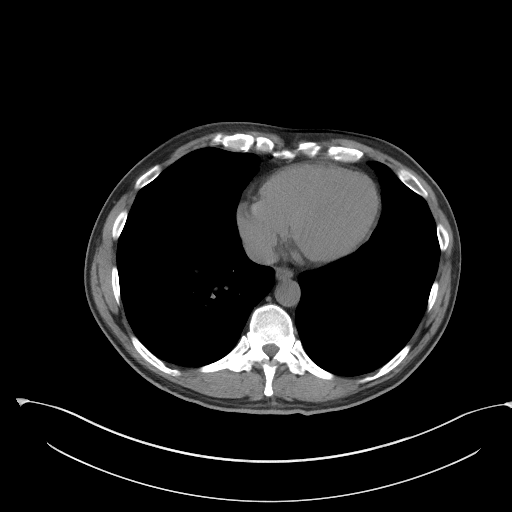

[Series 5: coronal st · coronal · 0.88mm/px · 3 of 92 slices shown]
[im 31/92  soft-tissue]
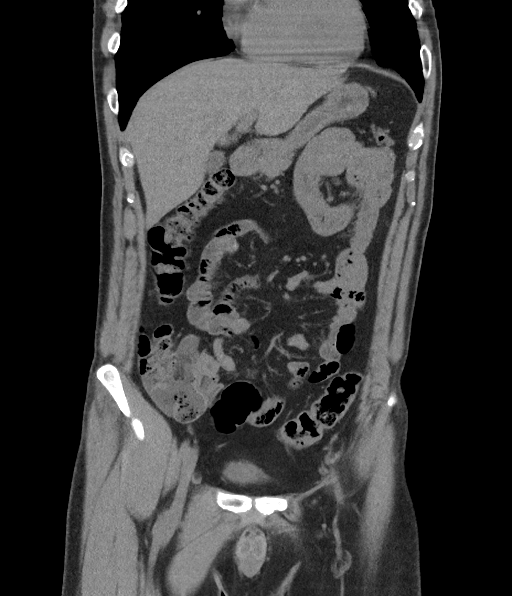
[im 41/92  soft-tissue]
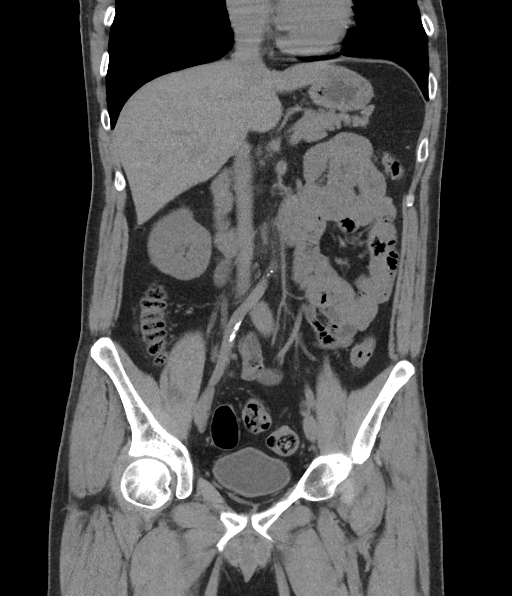
[im 51/92  soft-tissue]
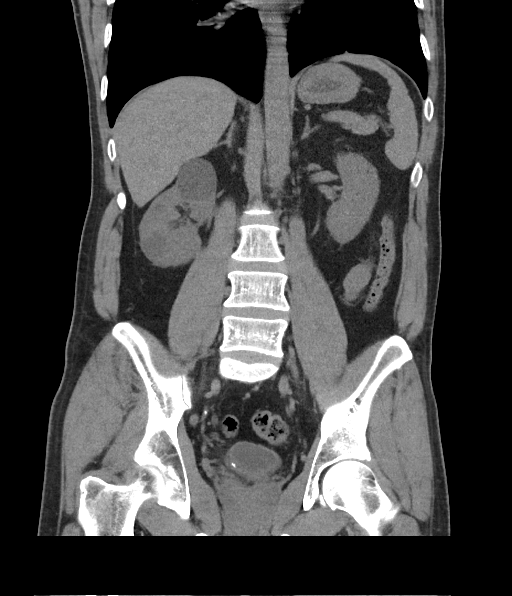

[16 of 46 positions shown; findings below may reference images not displayed]

FINDINGS: Lower chest: The lung bases are clear. The imaged heart is
unremarkable.

Hepatobiliary: The liver is unremarkable, within the confines of
noncontrast technique. The gallbladder is unremarkable. There is no
biliary ductal dilatation.

Pancreas: Unremarkable.

Spleen: Unremarkable.

Adrenals/Urinary Tract: The adrenals are unremarkable.

Numerous renal cysts are again seen bilaterally, for which no
specific imaging follow-up is required. There are a few punctate
nonobstructing renal stones bilaterally. There is a 4 mm stone at
the right UVJ with trace hydroureter but no frank hydronephrosis.
There are no obstructing stones on the left. There is no left
hydroureteronephrosis. The bladder is unremarkable.

Stomach/Bowel: The stomach is unremarkable. There is no evidence of
bowel obstruction there is no abnormal bowel wall thickening or
inflammatory change. The appendix is normal.

Vascular/Lymphatic: There is scattered calcified atherosclerotic
plaque in the nonaneurysmal abdominal aorta. There is no abdominal
or pelvic lymphadenopathy.

Reproductive: Prostate and seminal vesicles are unremarkable.

Other: There is no ascites or free air.

Musculoskeletal: There is no acute osseous abnormality or aggressive
osseous lesion.
IMPRESSION: 1. 4 mm stone at the right UVJ with mild upstream hydroureter but no
frank hydronephrosis.
2. Additional punctate nonobstructing stones bilaterally.
3.  Aortic Atherosclerosis ([id number]).

## 2023-02-03 ENCOUNTER — Other Ambulatory Visit: Payer: Self-pay | Admitting: Family Medicine

## 2023-02-03 DIAGNOSIS — I10 Essential (primary) hypertension: Secondary | ICD-10-CM

## 2023-02-05 ENCOUNTER — Ambulatory Visit: Payer: 59 | Admitting: Family Medicine

## 2023-02-05 ENCOUNTER — Other Ambulatory Visit: Payer: Self-pay

## 2023-02-05 ENCOUNTER — Encounter (HOSPITAL_COMMUNITY): Payer: Self-pay | Admitting: *Deleted

## 2023-02-05 ENCOUNTER — Emergency Department (HOSPITAL_COMMUNITY)
Admission: EM | Admit: 2023-02-05 | Discharge: 2023-02-05 | Disposition: A | Discharge status: Home / Self Care | Payer: 59 | Attending: Emergency Medicine | Admitting: Emergency Medicine

## 2023-02-05 ENCOUNTER — Emergency Department (HOSPITAL_COMMUNITY): Payer: 59

## 2023-02-05 VITALS — BP 138/90 | HR 77 | Temp 98.2°F | Ht 74.0 in | Wt 162.8 lb

## 2023-02-05 DIAGNOSIS — I10 Essential (primary) hypertension: Secondary | ICD-10-CM | POA: Diagnosis not present

## 2023-02-05 DIAGNOSIS — R200 Anesthesia of skin: Secondary | ICD-10-CM | POA: Diagnosis not present

## 2023-02-05 DIAGNOSIS — R079 Chest pain, unspecified: Secondary | ICD-10-CM | POA: Diagnosis present

## 2023-02-05 DIAGNOSIS — R002 Palpitations: Secondary | ICD-10-CM | POA: Diagnosis not present

## 2023-02-05 DIAGNOSIS — Z79899 Other long term (current) drug therapy: Secondary | ICD-10-CM | POA: Diagnosis not present

## 2023-02-05 DIAGNOSIS — R410 Disorientation, unspecified: Secondary | ICD-10-CM

## 2023-02-05 DIAGNOSIS — R0789 Other chest pain: Secondary | ICD-10-CM

## 2023-02-05 DIAGNOSIS — R202 Paresthesia of skin: Secondary | ICD-10-CM

## 2023-02-05 DIAGNOSIS — R519 Headache, unspecified: Secondary | ICD-10-CM

## 2023-02-05 LAB — BASIC METABOLIC PANEL
Anion gap: 7 (ref 5–15)
BUN: 13 mg/dL (ref 6–20)
CO2: 29 mmol/L (ref 22–32)
Calcium: 8.8 mg/dL — ABNORMAL LOW (ref 8.9–10.3)
Chloride: 100 mmol/L (ref 98–111)
Creatinine, Ser: 0.94 mg/dL (ref 0.61–1.24)
GFR, Estimated: >60 mL/min (ref >60)
Glucose, Bld: 80 mg/dL (ref 70–99)
Potassium: 3.7 mmol/L (ref 3.5–5.1)
Sodium: 136 mmol/L (ref 135–145)

## 2023-02-05 LAB — CBC WITH DIFFERENTIAL/PLATELET
Abs Immature Granulocytes: 0.01 10*3/uL (ref 0.00–0.07)
Basophils Absolute: 0 10*3/uL (ref 0.0–0.1)
Basophils Relative: 0 %
Eosinophils Absolute: 0.4 10*3/uL (ref 0.0–0.5)
Eosinophils Relative: 5 %
HCT: 49.4 % (ref 39.0–52.0)
Hemoglobin: 16.7 g/dL (ref 13.0–17.0)
Immature Granulocytes: 0 %
Lymphocytes Relative: 30 %
Lymphs Abs: 2.2 10*3/uL (ref 0.7–4.0)
MCH: 30.4 pg (ref 26.0–34.0)
MCHC: 33.8 g/dL (ref 30.0–36.0)
MCV: 89.8 fL (ref 80.0–100.0)
Monocytes Absolute: 0.6 10*3/uL (ref 0.1–1.0)
Monocytes Relative: 8 %
Neutro Abs: 4.2 10*3/uL (ref 1.7–7.7)
Neutrophils Relative %: 57 %
Platelets: 221 10*3/uL (ref 150–400)
RBC: 5.5 MIL/uL (ref 4.22–5.81)
RDW: 12.1 % (ref 11.5–15.5)
WBC: 7.3 10*3/uL (ref 4.0–10.5)
nRBC: 0 % (ref 0.0–0.2)

## 2023-02-05 LAB — TROPONIN I (HIGH SENSITIVITY): Troponin I (High Sensitivity): 3 ng/L (ref <18)

## 2023-02-05 MED ORDER — LOSARTAN POTASSIUM 100 MG PO TABS: 100.0000 mg | ORAL_TABLET | Freq: Every day | ORAL | 0 refills | Status: DC

## 2023-02-05 MED ORDER — LOSARTAN POTASSIUM 100 MG PO TABS: 100.0000 mg | ORAL_TABLET | Freq: Every day | ORAL | 5 refills | Status: DC

## 2023-02-05 NOTE — Patient Instructions (Addendum)
Thank you for coming in today.  I am sorry you did not feel well.  As we discussed I do have some concerns with some of your symptoms including the chest heaviness, the confusion and foggy feeling and the new headache with elevated blood pressure.  I do not see any acute findings on the EKG here today or any acute findings on your neurologic exam but I do want you to be seen to the ER as soon as you leave here.  If any change in symptoms or chest heaviness returns pull over and call 911.

## 2023-02-05 NOTE — Discharge Instructions (Signed)
Follow up with your doctor for refills of your blood pressure medication.   Return to the ED at any time you have new or worsening symptoms.

## 2023-02-05 NOTE — ED Provider Notes (Signed)
EMERGENCY DEPARTMENT AT [facility] Provider Note   CSN: [id number] Arrival date & time: 02/05/23  1324     History  No chief complaint on file.   Curtis Snow is a 47 y.o. male.  Patient to ED from PCP office for evaluation of symptoms described as poor concentration and focus over the last 2-3 days, and intermittent mild chest tightness and SOB. No symptoms currently. He reports being out of his hypertension medications for the past week. No recent fever, vomiting, LE edema.   The history is provided by the patient. No language interpreter was used.       Home Medications Prior to Admission medications   Medication Sig Start Date End Date Taking? Authorizing Provider  HYDROcodone-acetaminophen (NORCO/VICODIN) 5-325 MG tablet Take 1 tablet by mouth every 6 (six) hours as needed for moderate pain. Patient not taking: Reported on 07/20/2022 08/12/21   Vanetta Mulders, MD  losartan (COZAAR) 100 MG tablet Take 1 tablet (100 mg total) by mouth daily. 02/05/23   Elpidio Anis, PA-C  naproxen (NAPROSYN) 500 MG tablet Take 1 tablet (500 mg total) by mouth 2 (two) times daily. Patient not taking: Reported on 07/20/2022 08/12/21   Vanetta Mulders, MD  ondansetron (ZOFRAN-ODT) 4 MG disintegrating tablet Take 1 tablet (4 mg total) by mouth every 8 (eight) hours as needed for nausea or vomiting. Patient not taking: Reported on 07/20/2022 08/12/21   Vanetta Mulders, MD  tamsulosin (FLOMAX) 0.4 MG CAPS capsule Take 1 capsule (0.4 mg total) by mouth daily. Patient not taking: Reported on 08/12/2021 05/08/21   Bethann Berkshire, MD      Allergies    Penicillins    Review of Systems   Review of Systems  Physical Exam Updated Vital Signs BP (!) 153/92 (BP Location: Left Arm)   Pulse (!) 58   Temp 97.7 F (36.5 C) (Oral)   Resp 16   Ht 6\' 2"  (1.88 m)   Wt 72.6 kg   SpO2 100%   BMI 20.54 kg/m  Physical Exam Vitals and nursing note reviewed.  Constitutional:       Appearance: Normal appearance.  Neck:     Vascular: No carotid bruit.  Cardiovascular:     Rate and Rhythm: Normal rate and regular rhythm.     Heart sounds: No murmur heard. Pulmonary:     Effort: Pulmonary effort is normal.     Breath sounds: No wheezing, rhonchi or rales.  Abdominal:     Palpations: Abdomen is soft.     Tenderness: There is no abdominal tenderness.  Musculoskeletal:        General: Normal range of motion.     Cervical back: Normal range of motion and neck supple.     Right lower leg: No edema.     Left lower leg: No edema.  Skin:    General: Skin is warm and dry.  Neurological:     Mental Status: He is alert and oriented to person, place, and time.     ED Results / Procedures / Treatments   Labs (all labs ordered are listed, but only abnormal results are displayed) Labs Reviewed  BASIC METABOLIC PANEL - Abnormal; Notable for the following components:      Result Value   Calcium 8.8 (*)    All other components within normal limits  CBC WITH DIFFERENTIAL/PLATELET  TROPONIN I (HIGH SENSITIVITY)  TROPONIN I (HIGH SENSITIVITY)   Results for orders placed or performed during the hospital encounter  of 02/05/23 (from the past 24 hour(s))  CBC with Differential     Status: None   Collection Time: 02/05/23  2:21 PM  Result Value Ref Range   WBC 7.3 4.0 - 10.5 K/uL   RBC 5.50 4.22 - 5.81 MIL/uL   Hemoglobin 16.7 13.0 - 17.0 g/dL   HCT 17.6 16.0 - 73.7 %   MCV 89.8 80.0 - 100.0 fL   MCH 30.4 26.0 - 34.0 pg   MCHC 33.8 30.0 - 36.0 g/dL   RDW 10.6 26.9 - 48.5 %   Platelets 221 150 - 400 K/uL   nRBC 0.0 0.0 - 0.2 %   Neutrophils Relative % 57 %   Neutro Abs 4.2 1.7 - 7.7 K/uL   Lymphocytes Relative 30 %   Lymphs Abs 2.2 0.7 - 4.0 K/uL   Monocytes Relative 8 %   Monocytes Absolute 0.6 0.1 - 1.0 K/uL   Eosinophils Relative 5 %   Eosinophils Absolute 0.4 0.0 - 0.5 K/uL   Basophils Relative 0 %   Basophils Absolute 0.0 0.0 - 0.1 K/uL   Immature  Granulocytes 0 %   Abs Immature Granulocytes 0.01 0.00 - 0.07 K/uL  Basic metabolic panel     Status: Abnormal   Collection Time: 02/05/23  2:21 PM  Result Value Ref Range   Sodium 136 135 - 145 mmol/L   Potassium 3.7 3.5 - 5.1 mmol/L   Chloride 100 98 - 111 mmol/L   CO2 29 22 - 32 mmol/L   Glucose, Bld 80 70 - 99 mg/dL   BUN 13 6 - 20 mg/dL   Creatinine, Ser 4.62 0.61 - 1.24 mg/dL   Calcium 8.8 (L) 8.9 - 10.3 mg/dL   GFR, Estimated >70 >35 mL/min   Anion gap 7 5 - 15  Troponin I (High Sensitivity)     Status: None   Collection Time: 02/05/23  2:21 PM  Result Value Ref Range   Troponin I (High Sensitivity) 3 <18 ng/L     EKG EKG Interpretation Date/Time:  Monday February 05 2023 13:45:01 EDT Ventricular Rate:  73 PR Interval:  190 QRS Duration:  96 QT Interval:  374 QTC Calculation: 412 R Axis:   79  Text Interpretation: Normal sinus rhythm Right atrial enlargement Minimal voltage criteria for LVH, may be normal variant ( Sokolow-Lyon ) Nonspecific ST abnormality No significant change from prior EKG Confirmed by Vivi Barrack [phone]) on 02/05/2023 1:47:42 PM  Radiology DG Chest 2 View  Result Date: 02/05/2023 CLINICAL DATA:  Chest pain EXAM: CHEST - 2 VIEW COMPARISON:  08/31/2015 FINDINGS: Lungs are clear.  No pleural effusion or pneumothorax. The heart is normal in size. Visualized osseous structures are within normal limits. IMPRESSION: Normal chest radiographs. Electronically Signed   By: Charline Bills M.D.   On: 02/05/2023 15:26    Procedures Procedures    Medications Ordered in ED Medications - No data to display  ED Course/ Medical Decision Making/ A&P                                 Medical Decision Making This patient presents to the ED for concern of poor focus and chest tightness, this involves an extensive number of treatment options, and is a complaint that carries with it a high risk of complications and morbidity.  The differential diagnosis includes  neurologic event, ACS, PNA/infection, dehydration, hypertensive crisis   Co morbidities that complicate the  patient evaluation  Smoker, out of medications   Additional history obtained:  Additional history obtained from medical record  Lab Tests:  I Ordered, and personally interpreted labs.  The pertinent results include:  Negative troponin x 1 in asymptomatic patient, normal electrolytes, no leukocytosis, normal hemoglobin    Imaging Studies ordered:  I ordered imaging studies including chest xray  I independently visualized and interpreted imaging which showed chest xray unremarkable I agree with the radiologist interpretation   Cardiac Monitoring: / EKG:   Problem List / ED Course / Critical interventions / Medication management  Asymptomatic patient with normal cardiac evaluation - doubt ACS. No neurologic deficits - doubt stroke, TIA, bleed. I ordered medication including Losartan  for regular use as per history    Social Determinants of Health:  Smoker Has established relationship with PCP   Test / Admission - Considered:  No admission is considered for this patient. He is felt appropriate and stable for discharge home. He admits to being under significant stress, not sleeping which is like the source of his poor concentration and focus. No cardiac event identified by labs, EKG, CXR.     Amount and/or Complexity of Data Reviewed Labs: ordered. Radiology: ordered.  Risk Prescription drug management.           Final Clinical Impression(s) / ED Diagnoses Final diagnoses:  Nonspecific chest pain    Rx / DC Orders ED Discharge Orders          Ordered    losartan (COZAAR) 100 MG tablet  Daily        02/05/23 1551              Elpidio Anis, PA-C 02/05/23 1552    Gloris Manchester, MD 02/05/23 [phone]

## 2023-02-05 NOTE — ED Provider Triage Note (Signed)
Emergency Medicine Provider Triage Evaluation Note  Curtis Snow , a 47 y.o. male  was evaluated in triage.  Pt complains of poor concentration, poor focus, lightheadedness, chest tightness and SOB - all symptoms intermittent over the last 2-3 days.  Review of Systems  Positive: Chest tightness, lightheadedness Negative: Fever, nausea, vomiting, headache  Physical Exam  Ht 6\' 2"  (1.88 m)   Wt 72.6 kg   BMI 20.54 kg/m  Gen:   Awake, no distress   Resp:  Normal effort Good air movement, no wheezing Chest:  RRR, no murmur, no carotid bruit MSK:   Moves extremities without difficulty  Other:    Medical Decision Making  Medically screening exam initiated at 3:20 PM.  Appropriate orders placed.  Curtis Snow was informed that the remainder of the evaluation will be completed by another provider, this initial triage assessment does not replace that evaluation, and the importance of remaining in the ED until their evaluation is complete.  Labs, xray ordered.   Elpidio Anis, PA-C 02/05/23 1523

## 2023-02-05 NOTE — Progress Notes (Signed)
Subjective:  Patient ID: Curtis Snow, male    DOB: 02-Oct-1975  Age: 47 y.o. MRN: [id number]  CC:  Chief Complaint  Patient presents with   Hypertension    Pt reports has light headedness, palpitations, dizziness, notes did not realize he was out before the weekend.     HPI Curtis Snow presents for   Hypertension: With lightheadedness, slight dizziness, palpitations over the weekend.  Ran out of medications prior to the weekend, he usually takes losartan 100 mg daily.  Last dose 3 nights ago. Yesterday felt weak overall and tired. Some soreness in back of head, inside of head. No prior HA on this area. More pain noted with cough (chronic cough - overall about the same), or straining. No fever. Some chills today. Min nasal congestion. No known sick contacts or covid contact. Foggy headed today - normal speech, no facial droop, no focal weakness. Substernal chest heaviness, with some radiation to left chest. No arm radiationbut some tingling in left forearm and left hand intermittently since yesterday. Moves up to upper left arm at times. No current chest pain/heaviness - last felt at 8am - lasted few minutes - noticed with activity, not at rest. Improves in few min's of rest. Confused feeling this morning. About the same as yesterday. Some palpitations in evening at times - hard beat, pause, then flutter - few years. Has not been evaluated by cardiology.  No syncope. Increased dizziness past few days, feels off balance.   1ppd smoker. No hx of MI/CVA.   Home readings: BP Readings from Last 3 Encounters:  02/05/23 (!) 138/90  07/20/22 (!) 148/88  08/12/21 (!) 136/99   Lab Results  Component Value Date   CREATININE 0.94 07/27/2022   No data found.   History Patient Active Problem List   Diagnosis Date Noted   HTN (hypertension) 11/18/2019   Rectal bleeding 05/13/2019   Smoking    Past Medical History:  Diagnosis Date   Anxiety    DDD (degenerative disc disease)     saw Dr. Eduard Clos   Hypertension    Smoking    Past Surgical History:  Procedure Laterality Date   COLONOSCOPY N/A 05/20/2019   Procedure: COLONOSCOPY;  Surgeon: Corbin Ade, MD;  Location: [facility];  Service: Endoscopy;  Laterality: N/A;  1:30pm   NO PAST SURGERIES     POLYPECTOMY  05/20/2019   Procedure: POLYPECTOMY;  Surgeon: Corbin Ade, MD;  Location: [facility];  Service: Endoscopy;;  hepatic flexure   Allergies  Allergen Reactions   Penicillins Shortness Of Breath, Nausea And Vomiting and Rash    Did it involve swelling of the face/tongue/throat, SOB, or low BP? Yes Did it involve sudden or severe rash/hives, skin peeling, or any reaction on the inside of your mouth or nose? Yes Did you need to seek medical attention at a hospital or doctor's office? Yes When did it last happen?      childhood allergy If all above answers are "NO", may proceed with cephalosporin use.    Prior to Admission medications   Medication Sig Start Date End Date Taking? Authorizing Provider  losartan (COZAAR) 100 MG tablet Take 1 tablet (100 mg total) by mouth daily. 07/20/22  Yes Shade Flood, MD  HYDROcodone-acetaminophen (NORCO/VICODIN) 5-325 MG tablet Take 1 tablet by mouth every 6 (six) hours as needed for moderate pain. Patient not taking: Reported on 07/20/2022 08/12/21   Vanetta Mulders, MD  naproxen (NAPROSYN) 500 MG tablet  Take 1 tablet (500 mg total) by mouth 2 (two) times daily. Patient not taking: Reported on 07/20/2022 08/12/21   Vanetta Mulders, MD  ondansetron (ZOFRAN-ODT) 4 MG disintegrating tablet Take 1 tablet (4 mg total) by mouth every 8 (eight) hours as needed for nausea or vomiting. Patient not taking: Reported on 07/20/2022 08/12/21   Vanetta Mulders, MD  tamsulosin (FLOMAX) 0.4 MG CAPS capsule Take 1 capsule (0.4 mg total) by mouth daily. Patient not taking: Reported on 08/12/2021 05/08/21   Bethann Berkshire, MD   Social History   Socioeconomic History   Marital  status: Married    Spouse name: Not on file   Number of children: Not on file   Years of education: Not on file   Highest education level: Not on file  Occupational History   Occupation: welder  Tobacco Use   Smoking status: Every Day    Current packs/day: 1.00    Types: Cigarettes   Smokeless tobacco: Never   Tobacco comments:    occ vapes  Substance and Sexual Activity   Alcohol use: No    Comment: rare   Drug use: No   Sexual activity: Yes    Birth control/protection: None  Other Topics Concern   Not on file  Social History Narrative   Married since 2014.Unemployed.Lives with wife and son.   Social Determinants of Health   Financial Resource Strain: Not on file  Food Insecurity: Not on file  Transportation Needs: Not on file  Physical Activity: Not on file  Stress: Not on file  Social Connections: Unknown (09/11/2021)   Received from [facility], [facility]   Social Network    Social Network: Not on file  Intimate Partner Violence: Unknown (08/04/2021)   Received from [facility], [facility]   HITS    Physically Hurt: Not on file    Insult or Talk Down To: Not on file    Threaten Physical Harm: Not on file    Scream or Curse: Not on file    Review of Systems Per HPI  Objective:   Vitals:   02/05/23 1145  BP: (!) 138/90  Pulse: 77  Temp: 98.2 F (36.8 C)  TempSrc: Temporal  SpO2: 98%  Weight: 162 lb 12.8 oz (73.8 kg)  Height: 6\' 2"  (1.88 m)     Physical Exam Vitals reviewed.  Constitutional:      General: He is not in acute distress.    Appearance: He is well-developed. He is not ill-appearing, toxic-appearing or diaphoretic.  HENT:     Head: Normocephalic and atraumatic.  Neck:     Vascular: No carotid bruit or JVD.  Cardiovascular:     Rate and Rhythm: Normal rate and regular rhythm.     Pulses: Normal pulses.     Heart sounds: Normal heart sounds. No murmur heard.    Comments: Reg rate, no ectopy.  Pulmonary:     Effort:  Pulmonary effort is normal.     Breath sounds: Normal breath sounds. No rales.  Musculoskeletal:     Right lower leg: No edema.     Left lower leg: No edema.  Skin:    General: Skin is warm and dry.  Neurological:     General: No focal deficit present.     Mental Status: He is alert and oriented to person, place, and time.     GCS: GCS eye subscore is 4. GCS verbal subscore is 5. GCS motor subscore is 6.     Cranial  Nerves: No cranial nerve deficit, dysarthria or facial asymmetry.     Sensory: Sensation is intact.     Motor: No weakness, tremor, seizure activity or pronator drift.     Coordination: Romberg sign negative. Coordination normal. Finger-Nose-Finger Test and Heel to [facility] Test normal. Rapid alternating movements normal.     Gait: Gait is intact.  Psychiatric:        Mood and Affect: Mood normal.      EKG, sinus rhythm with rate 61.  Slight ST elevation in the anterolateral leads, appears similar to previous EKG on 05/06/2021.  Repolarization variant noted at that time.  No acute ST or T wave changes or apparent acute findings on today's EKG.  Assessment & Plan:  CRU KRITIKOS is a 47 y.o. male . Palpitation - Plan: EKG 12-Lead  Essential hypertension - Plan: losartan (COZAAR) 100 MG tablet  Chest heaviness  Numbness and tingling in left arm  Episodic confusion  Occipital headache  Symptoms above starting after running out of his losartan, last dose 3 nights ago.  Primarily did not feel well yesterday, and reports intermittent slight heaviness in chest lasting for few minutes, notes with activity then resolves.  Has not experienced the symptoms since 8:00 this morning and does not feel chest discomfort in office at this time.  Intermittent dysesthesias into his left arm, asymptomatic currently.  Also notes occipital headache with some foggy feeling, confused feeling, dizziness intermittently over the weekend as well, but nonfocal neurologic exam and vital signs were  stable in the office other than slight elevated blood pressure.  No apparent acute findings on EKG.  Given the above symptoms and elevated blood pressure recommended ER evaluation, discussed EMS transport.  He chose to drive by private vehicle, and denies current chest discomfort or focal weakness. 911 precautions were discussed if any return or worsening of symptoms in route to ER. Losartan refilled, but plan for follow-up after ER visit to decide if other lab work, testing, evaluation needed. Meds ordered this encounter  Medications   losartan (COZAAR) 100 MG tablet    Sig: Take 1 tablet (100 mg total) by mouth daily.    Dispense:  30 tablet    Refill:  5   Patient Instructions  Thank you for coming in today.  I am sorry you did not feel well.  As we discussed I do have some concerns with some of your symptoms including the chest heaviness, the confusion and foggy feeling and the new headache with elevated blood pressure.  I do not see any acute findings on the EKG here today or any acute findings on your neurologic exam but I do want you to be seen to the ER as soon as you leave here.  If any change in symptoms or chest heaviness returns pull over and call 911.      Signed,   Meredith Staggers, MD [facility], [facility] 02/05/23 12:49 PM

## 2023-02-05 NOTE — ED Triage Notes (Signed)
Pt with mid CP to left on Saturday off and on.  Left arm numbness Saturday afternoon off and on til today.  Pt seen PCP and sent here for r/o stroke and MI. Denies any numbness at present, denies any weakness on one side.  Denies any CP, + mild SOB. + fatigue.

## 2023-04-26 NOTE — Telephone Encounter (Signed)
error 

## 2023-04-30 NOTE — Telephone Encounter (Signed)
error 

## 2023-09-18 ENCOUNTER — Other Ambulatory Visit: Payer: Self-pay | Admitting: Family Medicine

## 2023-09-18 DIAGNOSIS — I10 Essential (primary) hypertension: Secondary | ICD-10-CM

## 2023-09-18 NOTE — Telephone Encounter (Signed)
 Copied from CRM [id number]. Topic: Clinical - Medication Refill >> Sep 18, 2023  8:38 AM Earnestine Goes B wrote: Medication: losartan  (COZAAR ) 100 MG tablet   Has the patient contacted their pharmacy? Yes (Agent: If no, request that the patient contact the pharmacy for the refill. If patient does not wish to contact the pharmacy document the reason why and proceed with request.) (Agent: If yes, when and what did the pharmacy advise?)  This is the patient's preferred pharmacy:  [facility] [address] [place] - [address] [place] [address] Phone: [phone] Fax: [phone]    Is this the correct pharmacy for this prescription? Yes If no, delete pharmacy and type the correct one.   Has the prescription been filled recently? Yes  Is the patient out of the medication? Yes  Has the patient been seen for an appointment in the last year OR does the patient have an upcoming appointment? Yes  Can we respond through MyChart? Yes  Agent: Please be advised that Rx refills may take up to 3 business days. We ask that you follow-up with your pharmacy.

## 2023-09-20 ENCOUNTER — Other Ambulatory Visit: Payer: Self-pay

## 2023-09-20 DIAGNOSIS — I10 Essential (primary) hypertension: Secondary | ICD-10-CM

## 2023-09-20 MED ORDER — LOSARTAN POTASSIUM 100 MG PO TABS: 100.0000 mg | ORAL_TABLET | Freq: Every day | ORAL | 0 refills | Status: DC

## 2023-10-24 ENCOUNTER — Other Ambulatory Visit: Payer: Self-pay | Admitting: Family Medicine

## 2023-10-24 DIAGNOSIS — I10 Essential (primary) hypertension: Secondary | ICD-10-CM

## 2023-11-07 ENCOUNTER — Encounter: Payer: Self-pay | Admitting: Family Medicine

## 2023-11-07 ENCOUNTER — Ambulatory Visit: Admitting: Family Medicine

## 2023-11-07 VITALS — BP 136/88 | HR 68 | Temp 99.2°F | Resp 16 | Ht 74.0 in | Wt 173.2 lb

## 2023-11-07 DIAGNOSIS — N529 Male erectile dysfunction, unspecified: Secondary | ICD-10-CM | POA: Diagnosis not present

## 2023-11-07 DIAGNOSIS — I10 Essential (primary) hypertension: Secondary | ICD-10-CM

## 2023-11-07 DIAGNOSIS — Z13220 Encounter for screening for lipoid disorders: Secondary | ICD-10-CM | POA: Diagnosis not present

## 2023-11-07 MED ORDER — SILDENAFIL CITRATE 50 MG PO TABS
25.0000 mg | ORAL_TABLET | Freq: Every day | ORAL | 0 refills | Status: AC | PRN
Start: 2023-11-07 — End: ?

## 2023-11-07 MED ORDER — LOSARTAN POTASSIUM 100 MG PO TABS
100.0000 mg | ORAL_TABLET | Freq: Every day | ORAL | 1 refills | Status: DC
Start: 2023-11-07 — End: 2024-01-14

## 2023-11-07 NOTE — Progress Notes (Signed)
 Subjective:  Patient ID: Curtis Snow, male    DOB: Nov 14, 1975  Age: 48 y.o. MRN: [id number]  CC:  Chief Complaint  Patient presents with   Medical Management of Chronic Issues    Pt is doing well has been taking Losartan  but concerned he should have this increased due to some higher readings, notes a few personal questions he did not feel comfortable sharing with MA that he would like to discuss with Dr     HPI Curtis Snow presents for  Follow-up of chronic conditions, last visit October 2024. No barriers to care, just busy with work.   Hypertension: With prior history of palpitations, noted when he had run out of his medicine previously.  Usually treated with losartan  100 mg daily.  If some his symptoms at his February 05, 2023 visit, ER visit recommended.  ER note reviewed, nonspecific chest pain, no cardiac event identified by labs EKG chest x-ray.  Felt to be situational stress. Has been taking losartan  once per day, spaced out at times as running out but daily past week.  Home readings: 145/95 on average.    BP Readings from Last 3 Encounters:  11/07/23 136/88  02/05/23 (!) 153/92  02/05/23 (!) 138/90   Lab Results  Component Value Date   CREATININE 0.94 02/05/2023   Erectile dysfunction: Trouble with maintaining erection at times. Able to achieve morning erection. Ex wife left last April - not active for awhile, new relationship since May and sexually active with new partner past month. Did have some difficulty with ex-wife.  Tried organic otc treatments - min relief.   No CP/DOE with intercourse, or exercise   History Patient Active Problem List   Diagnosis Date Noted   HTN (hypertension) 11/18/2019   Rectal bleeding 05/13/2019   Back pain 04/10/2019   Smoking    Past Medical History:  Diagnosis Date   Anxiety    DDD (degenerative disc disease)    saw Dr. Cole   Hypertension    Smoking    Past Surgical History:  Procedure Laterality Date    COLONOSCOPY N/A 05/20/2019   Procedure: COLONOSCOPY;  Surgeon: Shaaron Lamar HERO, MD;  Location: [facility];  Service: Endoscopy;  Laterality: N/A;  1:30pm   NO PAST SURGERIES     POLYPECTOMY  05/20/2019   Procedure: POLYPECTOMY;  Surgeon: Shaaron Lamar HERO, MD;  Location: [facility];  Service: Endoscopy;;  hepatic flexure   Allergies  Allergen Reactions   Penicillins Shortness Of Breath, Nausea And Vomiting and Rash    Did it involve swelling of the face/tongue/throat, SOB, or low BP? Yes Did it involve sudden or severe rash/hives, skin peeling, or any reaction on the inside of your mouth or nose? Yes Did you need to seek medical attention at a hospital or doctor's office? Yes When did it last happen?      childhood allergy If all above answers are "NO", may proceed with cephalosporin use.    Prior to Admission medications   Medication Sig Start Date End Date Taking? Authorizing Provider  losartan  (COZAAR ) 100 MG tablet Take 1 tablet by mouth once daily 10/24/23  Yes Levora Reyes SAUNDERS, MD   Social History   Socioeconomic History   Marital status: Married    Spouse name: Not on file   Number of children: Not on file   Years of education: Not on file   Highest education level: Not on file  Occupational History   Occupation: welder  Tobacco Use   Smoking status: Every Day    Current packs/day: 1.00    Types: Cigarettes   Smokeless tobacco: Never   Tobacco comments:    occ vapes  Substance and Sexual Activity   Alcohol use: No    Comment: rare   Drug use: No   Sexual activity: Yes    Birth control/protection: None  Other Topics Concern   Not on file  Social History Narrative   Married since 2014.Unemployed.Lives with wife and son.   Social Drivers of Corporate investment banker Strain: Not on file  Food Insecurity: Not on file  Transportation Needs: Not on file  Physical Activity: Not on file  Stress: Not on file  Social Connections: Unknown (09/11/2021)   Received  [facility]   Social Network    Social Network: Not on file  Intimate Partner Violence: Unknown (08/04/2021)   Received from [facility]   HITS    Physically Hurt: Not on file    Insult or Talk Down To: Not on file    Threaten Physical Harm: Not on file    Scream or Curse: Not on file    Review of Systems  Constitutional:  Negative for fatigue and unexpected weight change.  Eyes:  Negative for visual disturbance.  Respiratory:  Negative for cough, chest tightness and shortness of breath.   Cardiovascular:  Negative for chest pain, palpitations and leg swelling.  Gastrointestinal:  Negative for abdominal pain and blood in stool.  Neurological:  Negative for dizziness, light-headedness and headaches.     Objective:   Vitals:   11/07/23 1526 11/07/23 1545  BP: (!) 148/96 136/88  Pulse: 68   Resp: 16   Temp: 99.2 F (37.3 C)   TempSrc: Temporal   SpO2: 97%   Weight: 173 lb 3.2 oz (78.6 kg)   Height: 6' 2 (1.88 m)      Physical Exam Vitals reviewed.  Constitutional:      Appearance: He is well-developed.  HENT:     Head: Normocephalic and atraumatic.  Neck:     Vascular: No carotid bruit or JVD.  Cardiovascular:     Rate and Rhythm: Normal rate and regular rhythm.     Heart sounds: Normal heart sounds. No murmur heard. Pulmonary:     Effort: Pulmonary effort is normal.     Breath sounds: Normal breath sounds. No rales.  Musculoskeletal:     Right lower leg: No edema.     Left lower leg: No edema.  Skin:    General: Skin is warm and dry.  Neurological:     Mental Status: He is alert and oriented to person, place, and time.  Psychiatric:        Mood and Affect: Mood normal.        Assessment & Plan:  Curtis Snow is a 48 y.o. male . Erectile dysfunction, unspecified erectile dysfunction type - Plan: sildenafil  (VIAGRA ) 50 MG tablet  - Suspect a psychogenic component with the relationship, but handout given, discussed treatment options  including Viagra .  Hopefully short-term treatment.  Communication with partner regarding likely transitional symptoms. viagra  Rx given - use lowest effective dose. Side effects discussed (including but not limited to headache/flushing, blue discoloration of vision, possible vascular steal and risk of cardiac effects if underlying unknown coronary artery disease, and permanent sensorineural hearing loss). Understanding expressed.  Essential hypertension - Plan: losartan  (COZAAR ) 100 MG tablet, Comprehensive metabolic panel with GFR  - Borderline control, check  labs, continue same dose losartan , avoidance of higher salt foods with handout given on management of hypertension with recheck in 1 month.  Screening for hyperlipidemia - Plan: Lipid panel   Meds ordered this encounter  Medications   losartan  (COZAAR ) 100 MG tablet    Sig: Take 1 tablet (100 mg total) by mouth daily.    Dispense:  90 tablet    Refill:  1   sildenafil  (VIAGRA ) 50 MG tablet    Sig: Take 0.5-1 tablets (25-50 mg total) by mouth daily as needed for erectile dysfunction.    Dispense:  10 tablet    Refill:  0   Patient Instructions  Stay on losartan  same dose for now.  See info on blood pressure below, and list of foods with higher salt to avoid and follow up in 6 weeks to decide in changes if needed.  Lowest effective dose of Viagra  with precautions discussed.   Managing Your Hypertension Hypertension, also called high blood pressure, is when the force of the blood pressing against the walls of the arteries is too strong. Arteries are blood vessels that carry blood from your heart throughout your body. Hypertension forces the heart to work harder to pump blood and may cause the arteries to become narrow or stiff. Understanding blood pressure readings A blood pressure reading includes a higher number over a lower number: The first, or top, number is called the systolic pressure. It is a measure of the pressure in your  arteries as your heart beats. The second, or bottom number, is called the diastolic pressure. It is a measure of the pressure in your arteries as the heart relaxes. For most people, a normal blood pressure is below 120/80. Your personal target blood pressure may vary depending on your medical conditions, your age, and other factors. Blood pressure is classified into four stages. Based on your blood pressure reading, your health care provider may use the following stages to determine what type of treatment you need, if any. Systolic pressure and diastolic pressure are measured in a unit called millimeters of mercury (mmHg). Normal Systolic pressure: below 120. Diastolic pressure: below 80. Elevated Systolic pressure: 120-129. Diastolic pressure: below 80. Hypertension stage 1 Systolic pressure: 130-139. Diastolic pressure: 80-89. Hypertension stage 2 Systolic pressure: 140 or above. Diastolic pressure: 90 or above. How can this condition affect me? Managing your hypertension is very important. Over time, hypertension can damage the arteries and decrease blood flow to parts of the body, including the brain, heart, and kidneys. Having untreated or uncontrolled hypertension can lead to: A heart attack. A stroke. A weakened blood vessel (aneurysm). Heart failure. Kidney damage. Eye damage. Memory and concentration problems. Vascular dementia. What actions can I take to manage this condition? Hypertension can be managed by making lifestyle changes and possibly by taking medicines. Your health care provider will help you make a plan to bring your blood pressure within a normal range. You may be referred for counseling on a healthy diet and physical activity. Nutrition  Eat a diet that is high in fiber and potassium, and low in salt (sodium), added sugar, and fat. An example eating plan is called the DASH diet. DASH stands for Dietary Approaches to Stop Hypertension. To eat this way: Eat  plenty of fresh fruits and vegetables. Try to fill one-half of your plate at each meal with fruits and vegetables. Eat whole grains, such as whole-wheat pasta, brown rice, or whole-grain bread. Fill about one-fourth of your plate with whole grains.  Eat low-fat dairy products. Avoid fatty cuts of meat, processed or cured meats, and poultry with skin. Fill about one-fourth of your plate with lean proteins such as fish, chicken without skin, beans, eggs, and tofu. Avoid pre-made and processed foods. These tend to be higher in sodium, added sugar, and fat. Reduce your daily sodium intake. Many people with hypertension should eat less than 1,500 mg of sodium a day. Lifestyle  Work with your health care provider to maintain a healthy body weight or to lose weight. Ask what an ideal weight is for you. Get at least 30 minutes of exercise that causes your heart to beat faster (aerobic exercise) most days of the week. Activities may include walking, swimming, or biking. Include exercise to strengthen your muscles (resistance exercise), such as weight lifting, as part of your weekly exercise routine. Try to do these types of exercises for 30 minutes at least 3 days a week. Do not use any products that contain nicotine or tobacco. These products include cigarettes, chewing tobacco, and vaping devices, such as e-cigarettes. If you need help quitting, ask your health care provider. Control any long-term (chronic) conditions you have, such as high cholesterol or diabetes. Identify your sources of stress and find ways to manage stress. This may include meditation, deep breathing, or making time for fun activities. Alcohol use Do not drink alcohol if: Your health care provider tells you not to drink. You are pregnant, may be pregnant, or are planning to become pregnant. If you drink alcohol: Limit how much you have to: 0-1 drink a day for women. 0-2 drinks a day for men. Know how much alcohol is in your drink.  In the [place] one drink equals one 12 oz bottle of beer (355 mL), one 5 oz glass of wine (148 mL), or one 1 oz glass of hard liquor (44 mL). Medicines Your health care provider may prescribe medicine if lifestyle changes are not enough to get your blood pressure under control and if: Your systolic blood pressure is 130 or higher. Your diastolic blood pressure is 80 or higher. Take medicines only as told by your health care provider. Follow the directions carefully. Blood pressure medicines must be taken as told by your health care provider. The medicine does not work as well when you skip doses. Skipping doses also puts you at risk for problems. Monitoring Before you monitor your blood pressure: Do not smoke, drink caffeinated beverages, or exercise within 30 minutes before taking a measurement. Use the bathroom and empty your bladder (urinate). Sit quietly for at least 5 minutes before taking measurements. Monitor your blood pressure at home as told by your health care provider. To do this: Sit with your back straight and supported. Place your feet flat on the floor. Do not cross your legs. Support your arm on a flat surface, such as a table. Make sure your upper arm is at heart level. Each time you measure, take two or three readings one minute apart and record the results. You may also need to have your blood pressure checked regularly by your health care provider. General information Talk with your health care provider about your diet, exercise habits, and other lifestyle factors that may be contributing to hypertension. Review all the medicines you take with your health care provider because there may be side effects or interactions. Keep all follow-up visits. Your health care provider can help you create and adjust your plan for managing your high blood pressure. Where to find more  information National Heart, Lung, and Blood Institute: PopSteam.is American Heart Association:  www.heart.org Contact a health care provider if: You think you are having a reaction to medicines you have taken. You have repeated (recurrent) headaches. You feel dizzy. You have swelling in your ankles. You have trouble with your vision. Get help right away if: You develop a severe headache or confusion. You have unusual weakness or numbness, or you feel faint. You have severe pain in your chest or abdomen. You vomit repeatedly. You have trouble breathing. These symptoms may be an emergency. Get help right away. Call 911. Do not wait to see if the symptoms will go away. Do not drive yourself to the hospital. Summary Hypertension is when the force of blood pumping through your arteries is too strong. If this condition is not controlled, it may put you at risk for serious complications. Your personal target blood pressure may vary depending on your medical conditions, your age, and other factors. For most people, a normal blood pressure is less than 120/80. Hypertension is managed by lifestyle changes, medicines, or both. Lifestyle changes to help manage hypertension include losing weight, eating a healthy, low-sodium diet, exercising more, stopping smoking, and limiting alcohol. This information is not intended to replace advice given to you by your health care provider. Make sure you discuss any questions you have with your health care provider. Document Revised: 12/30/2020 Document Reviewed: 12/30/2020 Elsevier Patient Education  2024 Elsevier Inc.   Erectile Dysfunction Erectile dysfunction (ED) is the inability to get or keep an erection in order to have sexual intercourse. ED is considered a symptom of an underlying disorder and is not considered a disease. ED may include: Inability to get an erection. Lack of enough hardness of the erection to allow penetration. Loss of erection before sex is finished. What are the causes? This condition may be caused by: Physical causes,  such as: Artery problems. This may include heart disease, high blood pressure, atherosclerosis, and diabetes. Hormonal problems, such as low testosterone. Obesity. Nerve problems. This may include back or pelvic injuries, multiple sclerosis, Parkinson's disease, spinal cord injury, and stroke. Certain medicines, such as: Pain relievers. Antidepressants. Blood pressure medicines and water  pills (diuretics). Cancer medicines. Antihistamines. Muscle relaxants. Lifestyle factors, such as: Use of drugs such as marijuana, cocaine, or opioids. Excessive use of alcohol. Smoking. Lack of physical activity or exercise. Psychological causes, such as: Anxiety or stress. Sadness or depression. Exhaustion. Fear about sexual performance. Guilt. What are the signs or symptoms? Symptoms of this condition include: Inability to get an erection. Lack of enough hardness of the erection to allow penetration. Loss of the erection before sex is finished. Sometimes having normal erections, but with frequent unsatisfactory episodes. Low sexual satisfaction in either partner due to erection problems. A curved penis occurring with erection. The curve may cause pain, or the penis may be too curved to allow for intercourse. Never having nighttime or morning erections. How is this diagnosed? This condition is often diagnosed by: Performing a physical exam to find other diseases or specific problems with the penis. Asking you detailed questions about the problem. Doing tests, such as: Blood tests to check for diabetes mellitus or high cholesterol, or to measure hormone levels. Other tests to check for underlying health conditions. An ultrasound exam to check for scarring. A test to check blood flow to the penis. Doing a sleep study at home to measure nighttime erections. How is this treated? This condition may be treated by: Medicines, such  as: Medicine taken by mouth to help you achieve an erection  (oral medicine). Hormone replacement therapy to replace low testosterone levels. Medicine that is injected into the penis. Your health care provider may instruct you how to give yourself these injections at home. Medicine that is delivered with a short applicator tube. The tube is inserted into the opening at the tip of the penis, which is the opening of the urethra. A tiny pellet of medicine is put in the urethra. The pellet dissolves and enhances erectile function. This is also called MUSE (medicated urethral system for erections) therapy. Vacuum pump. This is a pump with a ring on it. The pump and ring are placed on the penis and used to create pressure that helps the penis become erect. Penile implant surgery. In this procedure, you may receive: An inflatable implant. This consists of cylinders, a pump, and a reservoir. The cylinders can be inflated with a fluid that helps to create an erection, and they can be deflated after intercourse. A semi-rigid implant. This consists of two silicone rubber rods. The rods provide some rigidity. They are also flexible, so the penis can both curve downward in its normal position and become straight for sexual intercourse. Blood vessel surgery to improve blood flow to the penis. During this procedure, a blood vessel from a different part of the body is placed into the penis to allow blood to flow around (bypass) damaged or blocked blood vessels. Lifestyle changes, such as exercising more, losing weight, and quitting smoking. Follow these instructions at home: Medicines  Take over-the-counter and prescription medicines only as told by your health care provider. Do not increase the dosage without first discussing it with your health care provider. If you are using self-injections, do injections as directed by your health care provider. Make sure you avoid any veins that are on the surface of the penis. After giving an injection, apply pressure to the injection  site for 5 minutes. Talk to your health care provider about how to prevent headaches while taking ED medicines. These medicines may cause a sudden headache due to the increase in blood flow in your body. General instructions Exercise regularly, as directed by your health care provider. Work with your health care provider to lose weight, if needed. Do not use any products that contain nicotine or tobacco. These products include cigarettes, chewing tobacco, and vaping devices, such as e-cigarettes. If you need help quitting, ask your health care provider. Before using a vacuum pump, read the instructions that come with the pump and discuss any questions with your health care provider. Keep all follow-up visits. This is important. Contact a health care provider if: You feel nauseous. You are vomiting. You get sudden headaches while taking ED medicines. You have any concerns about your sexual health. Get help right away if: You are taking oral or injectable medicines and you have an erection that lasts longer than 4 hours. If your health care provider is unavailable, go to the nearest emergency room for evaluation. An erection that lasts much longer than 4 hours can result in permanent damage to your penis. You have severe pain in your groin or abdomen. You develop redness or severe swelling of your penis. You have redness spreading at your groin or lower abdomen. You are unable to urinate. You experience chest pain or a rapid heartbeat (palpitations) after taking oral medicines. These symptoms may represent a serious problem that is an emergency. Do not wait to see if the symptoms  will go away. Get medical help right away. Call your local emergency services (911 in the [place] Do not drive yourself to the hospital. Summary Erectile dysfunction (ED) is the inability to get or keep an erection during sexual intercourse. This condition is diagnosed based on a physical exam, your symptoms, and tests to  determine the cause. Treatment varies depending on the cause and may include medicines, hormone therapy, surgery, or a vacuum pump. You may need follow-up visits to make sure that you are using your medicines or devices correctly. Get help right away if you are taking or injecting medicines and you have an erection that lasts longer than 4 hours. This information is not intended to replace advice given to you by your health care provider. Make sure you discuss any questions you have with your health care provider. Document Revised: 07/14/2020 Document Reviewed: 07/14/2020 Elsevier Patient Education  2024 Elsevier Inc.       Signed,   Reyes Pines, MD [facility], [facility] 11/07/23 4:18 PM

## 2023-11-07 NOTE — Patient Instructions (Addendum)
 Stay on losartan  same dose for now.  See info on blood pressure below, and list of foods with higher salt to avoid and follow up in 6 weeks to decide in changes if needed.  Lowest effective dose of Viagra  with precautions discussed.   Managing Your Hypertension Hypertension, also called high blood pressure, is when the force of the blood pressing against the walls of the arteries is too strong. Arteries are blood vessels that carry blood from your heart throughout your body. Hypertension forces the heart to work harder to pump blood and may cause the arteries to become narrow or stiff. Understanding blood pressure readings A blood pressure reading includes a higher number over a lower number: The first, or top, number is called the systolic pressure. It is a measure of the pressure in your arteries as your heart beats. The second, or bottom number, is called the diastolic pressure. It is a measure of the pressure in your arteries as the heart relaxes. For most people, a normal blood pressure is below 120/80. Your personal target blood pressure may vary depending on your medical conditions, your age, and other factors. Blood pressure is classified into four stages. Based on your blood pressure reading, your health care provider may use the following stages to determine what type of treatment you need, if any. Systolic pressure and diastolic pressure are measured in a unit called millimeters of mercury (mmHg). Normal Systolic pressure: below 120. Diastolic pressure: below 80. Elevated Systolic pressure: 120-129. Diastolic pressure: below 80. Hypertension stage 1 Systolic pressure: 130-139. Diastolic pressure: 80-89. Hypertension stage 2 Systolic pressure: 140 or above. Diastolic pressure: 90 or above. How can this condition affect me? Managing your hypertension is very important. Over time, hypertension can damage the arteries and decrease blood flow to parts of the body, including the brain,  heart, and kidneys. Having untreated or uncontrolled hypertension can lead to: A heart attack. A stroke. A weakened blood vessel (aneurysm). Heart failure. Kidney damage. Eye damage. Memory and concentration problems. Vascular dementia. What actions can I take to manage this condition? Hypertension can be managed by making lifestyle changes and possibly by taking medicines. Your health care provider will help you make a plan to bring your blood pressure within a normal range. You may be referred for counseling on a healthy diet and physical activity. Nutrition  Eat a diet that is high in fiber and potassium, and low in salt (sodium), added sugar, and fat. An example eating plan is called the DASH diet. DASH stands for Dietary Approaches to Stop Hypertension. To eat this way: Eat plenty of fresh fruits and vegetables. Try to fill one-half of your plate at each meal with fruits and vegetables. Eat whole grains, such as whole-wheat pasta, brown rice, or whole-grain bread. Fill about one-fourth of your plate with whole grains. Eat low-fat dairy products. Avoid fatty cuts of meat, processed or cured meats, and poultry with skin. Fill about one-fourth of your plate with lean proteins such as fish, chicken without skin, beans, eggs, and tofu. Avoid pre-made and processed foods. These tend to be higher in sodium, added sugar, and fat. Reduce your daily sodium intake. Many people with hypertension should eat less than 1,500 mg of sodium a day. Lifestyle  Work with your health care provider to maintain a healthy body weight or to lose weight. Ask what an ideal weight is for you. Get at least 30 minutes of exercise that causes your heart to beat faster (aerobic exercise) most days  of the week. Activities may include walking, swimming, or biking. Include exercise to strengthen your muscles (resistance exercise), such as weight lifting, as part of your weekly exercise routine. Try to do these types of  exercises for 30 minutes at least 3 days a week. Do not use any products that contain nicotine or tobacco. These products include cigarettes, chewing tobacco, and vaping devices, such as e-cigarettes. If you need help quitting, ask your health care provider. Control any long-term (chronic) conditions you have, such as high cholesterol or diabetes. Identify your sources of stress and find ways to manage stress. This may include meditation, deep breathing, or making time for fun activities. Alcohol use Do not drink alcohol if: Your health care provider tells you not to drink. You are pregnant, may be pregnant, or are planning to become pregnant. If you drink alcohol: Limit how much you have to: 0-1 drink a day for women. 0-2 drinks a day for men. Know how much alcohol is in your drink. In the [place] one drink equals one 12 oz bottle of beer (355 mL), one 5 oz glass of wine (148 mL), or one 1 oz glass of hard liquor (44 mL). Medicines Your health care provider may prescribe medicine if lifestyle changes are not enough to get your blood pressure under control and if: Your systolic blood pressure is 130 or higher. Your diastolic blood pressure is 80 or higher. Take medicines only as told by your health care provider. Follow the directions carefully. Blood pressure medicines must be taken as told by your health care provider. The medicine does not work as well when you skip doses. Skipping doses also puts you at risk for problems. Monitoring Before you monitor your blood pressure: Do not smoke, drink caffeinated beverages, or exercise within 30 minutes before taking a measurement. Use the bathroom and empty your bladder (urinate). Sit quietly for at least 5 minutes before taking measurements. Monitor your blood pressure at home as told by your health care provider. To do this: Sit with your back straight and supported. Place your feet flat on the floor. Do not cross your legs. Support your arm on  a flat surface, such as a table. Make sure your upper arm is at heart level. Each time you measure, take two or three readings one minute apart and record the results. You may also need to have your blood pressure checked regularly by your health care provider. General information Talk with your health care provider about your diet, exercise habits, and other lifestyle factors that may be contributing to hypertension. Review all the medicines you take with your health care provider because there may be side effects or interactions. Keep all follow-up visits. Your health care provider can help you create and adjust your plan for managing your high blood pressure. Where to find more information National Heart, Lung, and [employer] Institute: [employer] American Heart Association: www.heart.org Contact a health care provider if: You think you are having a reaction to medicines you have taken. You have repeated (recurrent) headaches. You feel dizzy. You have swelling in your ankles. You have trouble with your vision. Get help right away if: You develop a severe headache or confusion. You have unusual weakness or numbness, or you feel faint. You have severe pain in your chest or abdomen. You vomit repeatedly. You have trouble breathing. These symptoms may be an emergency. Get help right away. Call 911. Do not wait to see if the symptoms will go away. Do not drive  yourself to the hospital. Summary Hypertension is when the force of blood pumping through your arteries is too strong. If this condition is not controlled, it may put you at risk for serious complications. Your personal target blood pressure may vary depending on your medical conditions, your age, and other factors. For most people, a normal blood pressure is less than 120/80. Hypertension is managed by lifestyle changes, medicines, or both. Lifestyle changes to help manage hypertension include losing weight, eating a healthy,  low-sodium diet, exercising more, stopping smoking, and limiting alcohol. This information is not intended to replace advice given to you by your health care provider. Make sure you discuss any questions you have with your health care provider. Document Revised: 12/30/2020 Document Reviewed: 12/30/2020 Elsevier Patient Education  2024 [employer]   Erectile Dysfunction Erectile dysfunction (ED) is the inability to get or keep an erection in order to have sexual intercourse. ED is considered a symptom of an underlying disorder and is not considered a disease. ED may include: Inability to get an erection. Lack of enough hardness of the erection to allow penetration. Loss of erection before sex is finished. What are the causes? This condition may be caused by: Physical causes, such as: Artery problems. This may include heart disease, high blood pressure, atherosclerosis, and diabetes. Hormonal problems, such as low testosterone. Obesity. Nerve problems. This may include back or pelvic injuries, multiple sclerosis, Parkinson's disease, spinal cord injury, and stroke. Certain medicines, such as: Pain relievers. Antidepressants. Blood pressure medicines and water  pills (diuretics). Cancer medicines. Antihistamines. Muscle relaxants. Lifestyle factors, such as: Use of drugs such as marijuana, cocaine, or opioids. Excessive use of alcohol. Smoking. Lack of physical activity or exercise. Psychological causes, such as: Anxiety or stress. Sadness or depression. Exhaustion. Fear about sexual performance. Guilt. What are the signs or symptoms? Symptoms of this condition include: Inability to get an erection. Lack of enough hardness of the erection to allow penetration. Loss of the erection before sex is finished. Sometimes having normal erections, but with frequent unsatisfactory episodes. Low sexual satisfaction in either partner due to erection problems. A curved penis occurring  with erection. The curve may cause pain, or the penis may be too curved to allow for intercourse. Never having nighttime or morning erections. How is this diagnosed? This condition is often diagnosed by: Performing a physical exam to find other diseases or specific problems with the penis. Asking you detailed questions about the problem. Doing tests, such as: Blood tests to check for diabetes mellitus or high cholesterol, or to measure hormone levels. Other tests to check for underlying health conditions. An ultrasound exam to check for scarring. A test to check blood flow to the penis. Doing a sleep study at home to measure nighttime erections. How is this treated? This condition may be treated by: Medicines, such as: Medicine taken by mouth to help you achieve an erection (oral medicine). Hormone replacement therapy to replace low testosterone levels. Medicine that is injected into the penis. Your health care provider may instruct you how to give yourself these injections at home. Medicine that is delivered with a short applicator tube. The tube is inserted into the opening at the tip of the penis, which is the opening of the urethra. A tiny pellet of medicine is put in the urethra. The pellet dissolves and enhances erectile function. This is also called MUSE (medicated urethral system for erections) therapy. Vacuum pump. This is a pump with a ring on it. The pump and  ring are placed on the penis and used to create pressure that helps the penis become erect. Penile implant surgery. In this procedure, you may receive: An inflatable implant. This consists of cylinders, a pump, and a reservoir. The cylinders can be inflated with a fluid that helps to create an erection, and they can be deflated after intercourse. A semi-rigid implant. This consists of two silicone rubber rods. The rods provide some rigidity. They are also flexible, so the penis can both curve downward in its normal position and  become straight for sexual intercourse. Blood vessel surgery to improve blood flow to the penis. During this procedure, a blood vessel from a different part of the body is placed into the penis to allow blood to flow around (bypass) damaged or blocked blood vessels. Lifestyle changes, such as exercising more, losing weight, and quitting smoking. Follow these instructions at home: Medicines  Take over-the-counter and prescription medicines only as told by your health care provider. Do not increase the dosage without first discussing it with your health care provider. If you are using self-injections, do injections as directed by your health care provider. Make sure you avoid any veins that are on the surface of the penis. After giving an injection, apply pressure to the injection site for 5 minutes. Talk to your health care provider about how to prevent headaches while taking ED medicines. These medicines may cause a sudden headache due to the increase in blood flow in your body. General instructions Exercise regularly, as directed by your health care provider. Work with your health care provider to lose weight, if needed. Do not use any products that contain nicotine or tobacco. These products include cigarettes, chewing tobacco, and vaping devices, such as e-cigarettes. If you need help quitting, ask your health care provider. Before using a vacuum pump, read the instructions that come with the pump and discuss any questions with your health care provider. Keep all follow-up visits. This is important. Contact a health care provider if: You feel nauseous. You are vomiting. You get sudden headaches while taking ED medicines. You have any concerns about your sexual health. Get help right away if: You are taking oral or injectable medicines and you have an erection that lasts longer than 4 hours. If your health care provider is unavailable, go to the nearest emergency room for evaluation. An  erection that lasts much longer than 4 hours can result in permanent damage to your penis. You have severe pain in your groin or abdomen. You develop redness or severe swelling of your penis. You have redness spreading at your groin or lower abdomen. You are unable to urinate. You experience chest pain or a rapid heartbeat (palpitations) after taking oral medicines. These symptoms may represent a serious problem that is an emergency. Do not wait to see if the symptoms will go away. Get medical help right away. Call your local emergency services (911 in the [place] Do not drive yourself to the hospital. Summary Erectile dysfunction (ED) is the inability to get or keep an erection during sexual intercourse. This condition is diagnosed based on a physical exam, your symptoms, and tests to determine the cause. Treatment varies depending on the cause and may include medicines, hormone therapy, surgery, or a vacuum pump. You may need follow-up visits to make sure that you are using your medicines or devices correctly. Get help right away if you are taking or injecting medicines and you have an erection that lasts longer than 4 hours. This  information is not intended to replace advice given to you by your health care provider. Make sure you discuss any questions you have with your health care provider. Document Revised: 07/14/2020 Document Reviewed: 07/14/2020 Elsevier Patient Education  2024 [employer].

## 2023-11-08 LAB — COMPREHENSIVE METABOLIC PANEL WITH GFR
ALT: 14 U/L (ref 0–53)
AST: 18 U/L (ref 0–37)
Albumin: 4.2 g/dL (ref 3.5–5.2)
Alkaline Phosphatase: 89 U/L (ref 39–117)
BUN: 12 mg/dL (ref 6–23)
CO2: 32 meq/L (ref 19–32)
Calcium: 9.4 mg/dL (ref 8.4–10.5)
Chloride: 104 meq/L (ref 96–112)
Creatinine, Ser: 1.13 mg/dL (ref 0.40–1.50)
GFR: 77.24 mL/min (ref >60.00)
Glucose, Bld: 85 mg/dL (ref 70–99)
Potassium: 4.4 meq/L (ref 3.5–5.1)
Sodium: 140 meq/L (ref 135–145)
Total Bilirubin: 0.6 mg/dL (ref 0.2–1.2)
Total Protein: 7.1 g/dL (ref 6.0–8.3)

## 2023-11-08 LAB — LIPID PANEL
Cholesterol: 172 mg/dL (ref 0–200)
HDL: 31.8 mg/dL — ABNORMAL LOW (ref >39.00)
LDL Cholesterol: 94 mg/dL (ref 0–99)
NonHDL: 139.79
Total CHOL/HDL Ratio: 5
Triglycerides: 229 mg/dL — ABNORMAL HIGH (ref 0.0–149.0)
VLDL: 45.8 mg/dL — ABNORMAL HIGH (ref 0.0–40.0)

## 2023-11-11 ENCOUNTER — Ambulatory Visit: Payer: Self-pay | Admitting: Family Medicine

## 2023-11-19 NOTE — Progress Notes (Signed)
 Called patient and discussed lab results. Patient verbalized understanding and didn't have any questions at this time

## 2023-12-19 ENCOUNTER — Ambulatory Visit: Admitting: Family Medicine

## 2024-01-14 ENCOUNTER — Encounter: Payer: Self-pay | Admitting: Family Medicine

## 2024-01-14 ENCOUNTER — Ambulatory Visit (INDEPENDENT_AMBULATORY_CARE_PROVIDER_SITE_OTHER): Admitting: Family Medicine

## 2024-01-14 VITALS — BP 138/88 | HR 74 | Temp 98.4°F | Resp 15 | Ht 74.0 in | Wt 175.2 lb

## 2024-01-14 DIAGNOSIS — R0981 Nasal congestion: Secondary | ICD-10-CM | POA: Diagnosis not present

## 2024-01-14 DIAGNOSIS — I10 Essential (primary) hypertension: Secondary | ICD-10-CM

## 2024-01-14 DIAGNOSIS — F17210 Nicotine dependence, cigarettes, uncomplicated: Secondary | ICD-10-CM

## 2024-01-14 DIAGNOSIS — N529 Male erectile dysfunction, unspecified: Secondary | ICD-10-CM

## 2024-01-14 DIAGNOSIS — Z1211 Encounter for screening for malignant neoplasm of colon: Secondary | ICD-10-CM

## 2024-01-14 DIAGNOSIS — H6990 Unspecified Eustachian tube disorder, unspecified ear: Secondary | ICD-10-CM

## 2024-01-14 DIAGNOSIS — H9193 Unspecified hearing loss, bilateral: Secondary | ICD-10-CM | POA: Diagnosis not present

## 2024-01-14 LAB — BASIC METABOLIC PANEL WITH GFR
BUN: 12 mg/dL (ref 6–23)
CO2: 29 meq/L (ref 19–32)
Calcium: 9.7 mg/dL (ref 8.4–10.5)
Chloride: 104 meq/L (ref 96–112)
Creatinine, Ser: 1.01 mg/dL (ref 0.40–1.50)
GFR: 88.26 mL/min (ref >60.00)
Glucose, Bld: 78 mg/dL (ref 70–99)
Potassium: 4 meq/L (ref 3.5–5.1)
Sodium: 139 meq/L (ref 135–145)

## 2024-01-14 MED ORDER — LOSARTAN POTASSIUM-HCTZ 100-12.5 MG PO TABS: 1.0000 | ORAL_TABLET | Freq: Every day | ORAL | 1 refills | Status: DC

## 2024-01-14 MED ORDER — FLUTICASONE PROPIONATE 50 MCG/ACT NA SUSP
1.0000 | Freq: Every day | NASAL | 6 refills | Status: AC
Start: 2024-01-14 — End: ?

## 2024-01-14 NOTE — Patient Instructions (Addendum)
 Thank you for coming in today.  With elevated blood pressures at home and is still slight elevations in office, lets try changing your blood pressure medication to a combo pill.  This will have the same dose of losartan  that you are currently taking with an additional medication called hydrochlorothiazide .  Take that in the morning as sometimes you will notice some increased frequency of urination.  Let me know if other side effects or trouble tolerating this new medicine.  Check your blood pressures at home.  If any low readings or not tolerating this medicine let me know right away.    Sensation in the ears, cough and congestion could be related to some sinus congestion, eustachian tube dysfunction as we discussed.  I am happy to refer you to ear nose and throat doctor, but in the meantime try some fluticasone  nasal spray 1 to 2 sprays in each nostril once per day.  When using that medication try to keep the head facing forward, do not lean back and try not to sniff after using the medication as that will cause it to go down your throat.    See information below on smoking and smoking cessation.  Let us  know if we can help further.    If there are any concerns on your bloodwork, I will let you know. Take care!  Steps to Quit Smoking Smoking tobacco is the leading cause of preventable death. It can affect almost every organ in the body. Smoking puts you and those around you at risk for developing many serious chronic diseases. Quitting smoking can be very challenging. Do not get discouraged if you are not successful the first time. Some people need to make many attempts to quit before they achieve long-term success. Do your best to stick to your quit plan, and talk with your health care provider if you have any questions or concerns. How do I get ready to quit? When you decide to quit smoking, create a plan to help you succeed. Before you quit: Pick a date to quit. Set a date within the next 2 weeks to  give you time to prepare. Write down the reasons why you are quitting. Keep this list in places where you will see it often. Tell your family, friends, and co-workers that you are quitting. Support from people you are close to can make quitting easier. Talk with your health care provider about your options for quitting smoking. Find out what treatment options are covered by your health insurance. Identify people, places, things, and activities that make you want to smoke (triggers). Avoid them. What first steps can I take to quit smoking? Throw away all cigarettes at home, at work, and in your car. Throw away smoking accessories, such as Set designer. Clean your car. Make sure to empty the ashtray. Clean your home, including curtains and carpets. What strategies can I use to quit smoking? Talk with your health care provider about combining strategies, such as taking medicines while you are also receiving in-person counseling. Using these two strategies together makes you more likely to succeed in quitting than if you used either strategy on its own. If you are pregnant or breastfeeding, talk with your health care provider about finding counseling or other support strategies to quit smoking. Do not take medicine to help you quit smoking unless your health care provider tells you to. Quit right away Quit smoking completely, instead of gradually reducing how much you smoke over a period of time. Stopping  smoking right away may be more successful than gradually quitting. Attend in-person counseling to help you build problem-solving skills. You are more likely to succeed in quitting if you attend counseling sessions regularly. Even short sessions of 10 minutes can be effective. Take medicine You may take medicines to help you quit smoking. Some medicines require a prescription. You can also purchase over-the-counter medicines. Medicines may have nicotine in them to replace the nicotine in  cigarettes. Medicines may: Help to stop cravings. Help to relieve withdrawal symptoms. Your health care provider may recommend: Nicotine patches, gum, or lozenges. Nicotine inhalers or sprays. Non-nicotine medicine that you take by mouth. Find resources Find resources and support systems that can help you quit smoking and remain smoke-free after you quit. These resources are most helpful when you use them often. They include: Online chats with a Veterinary surgeon. Telephone quitlines. Printed Materials engineer. Support groups or group counseling. Text messaging programs. Mobile phone apps or applications. Use apps that can help you stick to your quit plan by providing reminders, tips, and encouragement. Examples of free services include Quit Guide from the CDC and smokefree.gov  What can I do to make it easier to quit?  Reach out to your family and friends for support and encouragement. Call telephone quitlines, such as 1-800-QUIT-NOW, reach out to support groups, or work with a counselor for support. Ask people who smoke to avoid smoking around you. Avoid places that trigger you to smoke, such as bars, parties, or smoke-break areas at work. Spend time with people who do not smoke. Lessen the stress in your life. Stress can be a smoking trigger for some people. To lessen stress, try: Exercising regularly. Doing deep-breathing exercises. Doing yoga. Meditating. What benefits will I see if I quit smoking? Over time, you should start to see positive results, such as: Improved sense of smell and taste. Decreased coughing and sore throat. Slower heart rate. Lower blood pressure. Clearer and healthier skin. The ability to breathe more easily. Fewer sick days. Summary Quitting smoking can be very challenging. Do not get discouraged if you are not successful the first time. Some people need to make many attempts to quit before they achieve long-term success. When you decide to quit smoking,  create a plan to help you succeed. Quit smoking right away, not slowly over a period of time. Find resources and support systems that can help you quit smoking and remain smoke-free after you quit. This information is not intended to replace advice given to you by your health care provider. Make sure you discuss any questions you have with your health care provider. Document Revised: 04/08/2021 Document Reviewed: 04/08/2021 Elsevier Patient Education  2024 [employer]  Eustachian Tube Dysfunction  Eustachian tube dysfunction refers to a condition in which a blockage develops in the narrow passage that connects the middle ear to the back of the nose (eustachian tube). The eustachian tube regulates air pressure in the middle ear by letting air move between the ear and nose. It also helps to drain fluid from the middle ear space. Eustachian tube dysfunction can affect one or both ears. When the eustachian tube does not function properly, air pressure, fluid, or both can build up in the middle ear. What are the causes? This condition occurs when the eustachian tube becomes blocked or cannot open normally. Common causes of this condition include: Ear infections. Colds and other infections that affect the nose, mouth, and throat (upper respiratory tract). Allergies. Irritation from cigarette smoke. Irritation from  stomach acid coming up into the esophagus (gastroesophageal reflux). The esophagus is the part of the body that moves food from the mouth to the stomach. Sudden changes in air pressure, such as from descending in an airplane or scuba diving. Abnormal growths in the nose or throat, such as: Growths that line the nose (nasal polyps). Abnormal growth of cells (tumors). Enlarged tissue at the back of the throat (adenoids). What increases the risk? You are more likely to develop this condition if: You smoke. You are overweight. You are a child who has: Certain birth defects of the  mouth, such as cleft palate. Large tonsils or adenoids. What are the signs or symptoms? Common symptoms of this condition include: A feeling of fullness in the ear. Ear pain. Clicking or popping noises in the ear. Ringing in the ear (tinnitus). Hearing loss. Loss of balance. Dizziness. Symptoms may get worse when the air pressure around you changes, such as when you travel to an area of high elevation, fly on an airplane, or go scuba diving. How is this diagnosed? This condition may be diagnosed based on: Your symptoms. A physical exam of your ears, nose, and throat. Tests, such as those that measure: The movement of your eardrum. Your hearing (audiometry). How is this treated? Treatment depends on the cause and severity of your condition. In mild cases, you may relieve your symptoms by moving air into your ears. This is called popping the ears. In more severe cases, or if you have symptoms of fluid in your ears, treatment may include: Medicines to relieve congestion (decongestants). Medicines that treat allergies (antihistamines). Nasal sprays or ear drops that contain medicines that reduce swelling (steroids). A procedure to drain the fluid in your eardrum. In this procedure, a small tube may be placed in the eardrum to: Drain the fluid. Restore the air in the middle ear space. A procedure to insert a balloon device through the nose to inflate the opening of the eustachian tube (balloon dilation). Follow these instructions at home: Lifestyle Do not do any of the following until your health care provider approves: Travel to high altitudes. Fly in airplanes. Work in a Estate agent or room. Scuba dive. Do not use any products that contain nicotine or tobacco. These products include cigarettes, chewing tobacco, and vaping devices, such as e-cigarettes. If you need help quitting, ask your health care provider. Keep your ears dry. Wear fitted earplugs during showering and  bathing. Dry your ears completely after. General instructions Take over-the-counter and prescription medicines only as told by your health care provider. Use techniques to help pop your ears as recommended by your health care provider. These may include: Chewing gum. Yawning. Frequent, forceful swallowing. Closing your mouth, holding your nose closed, and gently blowing as if you are trying to blow air out of your nose. Keep all follow-up visits. This is important. Contact a health care provider if: Your symptoms do not go away after treatment. Your symptoms come back after treatment. You are unable to pop your ears. You have: A fever. Pain in your ear. Pain in your head or neck. Fluid draining from your ear. Your hearing suddenly changes. You become very dizzy. You lose your balance. Get help right away if: You have a sudden, severe increase in any of your symptoms. Summary Eustachian tube dysfunction refers to a condition in which a blockage develops in the eustachian tube. It can be caused by ear infections, allergies, inhaled irritants, or abnormal growths in the nose or  throat. Symptoms may include ear pain or fullness, hearing loss, or ringing in the ears. Mild cases are treated with techniques to unblock the ears, such as yawning or chewing gum. More severe cases are treated with medicines or procedures. This information is not intended to replace advice given to you by your health care provider. Make sure you discuss any questions you have with your health care provider. Document Revised: 06/28/2020 Document Reviewed: 06/28/2020 Elsevier Patient Education  2024 [employer].

## 2024-01-14 NOTE — Progress Notes (Signed)
 Subjective:  Patient ID: Curtis Snow, male    DOB: Aug 18, 1975  Age: 48 y.o. MRN: [id number]  CC:  Chief Complaint  Patient presents with   Erectile Dysfunction   Hypertension   Cough   Ear Pain    Cracking noise. Would like ENT referral    HPI Curtis Snow presents for  Multiple concerns above  Hypertension: Curtis Snow  100 mg daily. No new side effects.  Home readings:140/95 range on average.  BP Readings from Last 3 Encounters:  01/14/24 138/88  11/07/23 136/88  02/05/23 (!) 153/92   Lab Results  Component Value Date   CREATININE 1.13 11/07/2023   Erectile dysfunction Has used sildenafil  50 mg tablet - only used once. Not currently sexually active. Out of prior relationship. Denies headache, flushing, changes in vision or hearing, or chest pain or new dyspnea with exertion.  Cough Has also noticed some ear pain with cracking noise. Sinus pressure, some productive cough at times - past 6 months to a year. Tried saline ns in past - not effective. Has deviated septum - saw ENT a long time ago. Wears ear buds at work.  Notices crackling noise with swallowing. Some difficulty with hearing at times in R, more than left. Past few months.   Nicotine addiction 1ppd. Has tried to quit cold [place]. Would like to quit. Did not tolerate Chantix in past.   Health maintenance Declines flu vaccine.  Colonoscopy in 2021.  Tubular adenoma, repeat 3 years.  Referral placed today.  History Patient Active Problem List   Diagnosis Date Noted   HTN (hypertension) 11/18/2019   Rectal bleeding 05/13/2019   Back pain 04/10/2019   Smoking    Past Medical History:  Diagnosis Date   Allergy 1979   Anxiety    DDD (degenerative disc disease)    saw Dr. Cole   Hypertension    Smoking    Past Surgical History:  Procedure Laterality Date   COLONOSCOPY N/A 05/20/2019   Procedure: COLONOSCOPY;  Surgeon: Curtis Lamar HERO, MD;  Location: [facility];  Service: Endoscopy;   Laterality: N/A;  1:30pm   NO PAST SURGERIES     POLYPECTOMY  05/20/2019   Procedure: POLYPECTOMY;  Surgeon: Curtis Lamar HERO, MD;  Location: [facility];  Service: Endoscopy;;  hepatic flexure   Allergies  Allergen Reactions   Penicillins Shortness Of Breath, Nausea And Vomiting and Rash    Did it involve swelling of the face/tongue/throat, SOB, or low BP? Yes Did it involve sudden or severe rash/hives, skin peeling, or any reaction on the inside of your mouth or nose? Yes Did you need to seek medical attention at a hospital or doctor's office? Yes When did it last happen?      childhood allergy If all above answers are "NO", may proceed with cephalosporin use.    Prior to Admission medications   Medication Sig Start Date End Date Taking? Authorizing Provider  Curtis Snow  (COZAAR ) 100 MG tablet Take 1 tablet (100 mg total) by mouth daily. 11/07/23  Yes Curtis Reyes SAUNDERS, MD  sildenafil  (VIAGRA ) 50 MG tablet Take 0.5-1 tablets (25-50 mg total) by mouth daily as needed for erectile dysfunction. 11/07/23  Yes Curtis Reyes SAUNDERS, MD   Social History   Socioeconomic History   Marital status: Married    Spouse name: Not on file   Number of children: Not on file   Years of education: Not on file   Highest education level: Not on file  Occupational  History   Occupation: welder  Tobacco Use   Smoking status: Every Day    Current packs/day: 1.00    Types: Cigarettes   Smokeless tobacco: Never   Tobacco comments:    occ vapes  Substance and Sexual Activity   Alcohol use: No    Comment: rare   Drug use: No   Sexual activity: Yes    Birth control/protection: None  Other Topics Concern   Not on file  Social History Narrative   Married since 2014.Unemployed.Lives with wife and son.   Social Drivers of Corporate investment banker Strain: Not on file  Food Insecurity: Not on file  Transportation Needs: Not on file  Physical Activity: Not on file  Stress: Not on file  Social  Connections: Unknown (09/11/2021)   Received [facility]   Social Network    Social Network: Not on file  Intimate Partner Violence: Unknown (08/04/2021)   Received from [facility]   HITS    Physically Hurt: Not on file    Insult or Talk Down To: Not on file    Threaten Physical Harm: Not on file    Scream or Curse: Not on file    Review of Systems  Constitutional:  Negative for fatigue and unexpected weight change.  Eyes:  Negative for visual disturbance.  Respiratory:  Negative for cough, chest tightness and shortness of breath.   Cardiovascular:  Negative for chest pain, palpitations and leg swelling.  Gastrointestinal:  Negative for abdominal pain and blood in stool.  Neurological:  Negative for dizziness, light-headedness and headaches.     Objective:   Vitals:   01/14/24 0850  BP: 138/88  Pulse: 74  Resp: 15  Temp: 98.4 F (36.9 C)  TempSrc: Temporal  SpO2: 96%  Weight: 175 lb 3.2 oz (79.5 kg)  Height: 6' 2 (1.88 m)     Physical Exam Vitals reviewed.  Constitutional:      Appearance: He is well-developed.  HENT:     Head: Normocephalic and atraumatic.     Right Ear: Tympanic membrane, ear canal and external ear normal.     Left Ear: Tympanic membrane, ear canal and external ear normal.     Nose:     Comments: Narrow passageway on the right no active discharge from the nose.  No sinus pressure/pain on exam. Neck:     Vascular: No carotid bruit or JVD.  Cardiovascular:     Rate and Rhythm: Normal rate and regular rhythm.     Heart sounds: Normal heart sounds. No murmur heard. Pulmonary:     Effort: Pulmonary effort is normal.     Breath sounds: Normal breath sounds. No rales.  Musculoskeletal:     Right lower leg: No edema.     Left lower leg: No edema.  Lymphadenopathy:     Cervical: No cervical adenopathy.  Skin:    General: Skin is warm and dry.  Neurological:     Mental Status: He is alert and oriented to person, place, and time.   Psychiatric:        Mood and Affect: Mood normal.        Assessment & Plan:  Curtis Snow is a 48 y.o. male . Essential hypertension - Plan: Curtis Snow -hydrochlorothiazide  (HYZAAR) 100-12.5 MG tablet, Basic metabolic panel with GFR  - Still with decreased control including on home readings.  Will add HCTZ for combination with Curtis Snow  100 mg.  Potential side effects discussed, check labs, recheck 1 month.  Erectile dysfunction, unspecified erectile dysfunction type  - Denies recent need for sildenafil , has at home if needed, potential side effects and risks have been discussed.  Decreased hearing of both ears - Plan: Ambulatory referral to ENTSinus congestion - Plan: Ambulatory referral to ENT, fluticasone  (FLONASE ) 50 MCG/ACT nasal spray Dysfunction of Eustachian tube, unspecified laterality - Plan: Ambulatory referral to ENT, fluticasone  (FLONASE ) 50 MCG/ACT nasal spray  - Suspected eustachian tube dysfunction with sinus congestion.  Ear exam overall is reassuring.  Potentially may have a small amount of clear fluid at the base but overall appears to be clear, no sign of infection, and no canal obstruction.  Recommend initial trial of Flonase  nasal spray, and will refer to ENT given change in hearing, reported history of deviated septum.  Correct technique of nasal spray discussed.  Cigarette nicotine dependence without complication  - Did not tolerate Chantix previously, handout given on smoking cessation, advised to let me know if we can help further.  Holding new meds for now.  Screen for colon cancer - Plan: Ambulatory referral to Gastroenterology  - 3-year repeat recommended in 2021, referral placed.  Meds ordered this encounter  Medications   Curtis Snow -hydrochlorothiazide  (HYZAAR) 100-12.5 MG tablet    Sig: Take 1 tablet by mouth daily.    Dispense:  90 tablet    Refill:  1   fluticasone  (FLONASE ) 50 MCG/ACT nasal spray    Sig: Place 1-2 sprays into both nostrils daily.     Dispense:  16 g    Refill:  6   Patient Instructions  Thank you for coming in today.  With elevated blood pressures at home and is still slight elevations in office, lets try changing your blood pressure medication to a combo pill.  This will have the same dose of Curtis Snow  that you are currently taking with an additional medication called hydrochlorothiazide .  Take that in the morning as sometimes you will notice some increased frequency of urination.  Let me know if other side effects or trouble tolerating this new medicine.  Check your blood pressures at home.  If any low readings or not tolerating this medicine let me know right away.    Sensation in the ears, cough and congestion could be related to some sinus congestion, eustachian tube dysfunction as we discussed.  I am happy to refer you to ear nose and throat doctor, but in the meantime try some fluticasone  nasal spray 1 to 2 sprays in each nostril once per day.  When using that medication try to keep the head facing forward, do not lean back and try not to sniff after using the medication as that will cause it to go down your throat.    See information below on smoking and smoking cessation.  Let us  know if we can help further.    If there are any concerns on your bloodwork, I will let you know. Take care!  Steps to Quit Smoking Smoking tobacco is the leading cause of preventable death. It can affect almost every organ in the body. Smoking puts you and those around you at risk for developing many serious chronic diseases. Quitting smoking can be very challenging. Do not get discouraged if you are not successful the first time. Some people need to make many attempts to quit before they achieve long-term success. Do your best to stick to your quit plan, and talk with your health care provider if you have any questions or concerns. How do I get ready to quit?  When you decide to quit smoking, create a plan to help you succeed. Before you  quit: Pick a date to quit. Set a date within the next 2 weeks to give you time to prepare. Write down the reasons why you are quitting. Keep this list in places where you will see it often. Tell your family, friends, and co-workers that you are quitting. Support from people you are close to can make quitting easier. Talk with your health care provider about your options for quitting smoking. Find out what treatment options are covered by your health insurance. Identify people, places, things, and activities that make you want to smoke (triggers). Avoid them. What first steps can I take to quit smoking? Throw away all cigarettes at home, at work, and in your car. Throw away smoking accessories, such as Set designer. Clean your car. Make sure to empty the ashtray. Clean your home, including curtains and carpets. What strategies can I use to quit smoking? Talk with your health care provider about combining strategies, such as taking medicines while you are also receiving in-person counseling. Using these two strategies together makes you more likely to succeed in quitting than if you used either strategy on its own. If you are pregnant or breastfeeding, talk with your health care provider about finding counseling or other support strategies to quit smoking. Do not take medicine to help you quit smoking unless your health care provider tells you to. Quit right away Quit smoking completely, instead of gradually reducing how much you smoke over a period of time. Stopping smoking right away may be more successful than gradually quitting. Attend in-person counseling to help you build problem-solving skills. You are more likely to succeed in quitting if you attend counseling sessions regularly. Even short sessions of 10 minutes can be effective. Take medicine You may take medicines to help you quit smoking. Some medicines require a prescription. You can also purchase over-the-counter medicines.  Medicines may have nicotine in them to replace the nicotine in cigarettes. Medicines may: Help to stop cravings. Help to relieve withdrawal symptoms. Your health care provider may recommend: Nicotine patches, gum, or lozenges. Nicotine inhalers or sprays. Non-nicotine medicine that you take by mouth. Find resources Find resources and support systems that can help you quit smoking and remain smoke-free after you quit. These resources are most helpful when you use them often. They include: Online chats with a Veterinary surgeon. Telephone quitlines. Printed Materials engineer. Support groups or group counseling. Text messaging programs. Mobile phone apps or applications. Use apps that can help you stick to your quit plan by providing reminders, tips, and encouragement. Examples of free services include Quit Guide from the CDC and smokefree.gov  What can I do to make it easier to quit?  Reach out to your family and friends for support and encouragement. Call telephone quitlines, such as 1-800-QUIT-NOW, reach out to support groups, or work with a counselor for support. Ask people who smoke to avoid smoking around you. Avoid places that trigger you to smoke, such as bars, parties, or smoke-break areas at work. Spend time with people who do not smoke. Lessen the stress in your life. Stress can be a smoking trigger for some people. To lessen stress, try: Exercising regularly. Doing deep-breathing exercises. Doing yoga. Meditating. What benefits will I see if I quit smoking? Over time, you should start to see positive results, such as: Improved sense of smell and taste. Decreased coughing and sore throat. Slower heart rate. Lower blood pressure.  Clearer and healthier skin. The ability to breathe more easily. Fewer sick days. Summary Quitting smoking can be very challenging. Do not get discouraged if you are not successful the first time. Some people need to make many attempts to quit before they  achieve long-term success. When you decide to quit smoking, create a plan to help you succeed. Quit smoking right away, not slowly over a period of time. Find resources and support systems that can help you quit smoking and remain smoke-free after you quit. This information is not intended to replace advice given to you by your health care provider. Make sure you discuss any questions you have with your health care provider. Document Revised: 04/08/2021 Document Reviewed: 04/08/2021 Elsevier Patient Education  2024 Elsevier Inc.  Eustachian Tube Dysfunction  Eustachian tube dysfunction refers to a condition in which a blockage develops in the narrow passage that connects the middle ear to the back of the nose (eustachian tube). The eustachian tube regulates air pressure in the middle ear by letting air move between the ear and nose. It also helps to drain fluid from the middle ear space. Eustachian tube dysfunction can affect one or both ears. When the eustachian tube does not function properly, air pressure, fluid, or both can build up in the middle ear. What are the causes? This condition occurs when the eustachian tube becomes blocked or cannot open normally. Common causes of this condition include: Ear infections. Colds and other infections that affect the nose, mouth, and throat (upper respiratory tract). Allergies. Irritation from cigarette smoke. Irritation from stomach acid coming up into the esophagus (gastroesophageal reflux). The esophagus is the part of the body that moves food from the mouth to the stomach. Sudden changes in air pressure, such as from descending in an airplane or scuba diving. Abnormal growths in the nose or throat, such as: Growths that line the nose (nasal polyps). Abnormal growth of cells (tumors). Enlarged tissue at the back of the throat (adenoids). What increases the risk? You are more likely to develop this condition if: You smoke. You are  overweight. You are a child who has: Certain birth defects of the mouth, such as cleft palate. Large tonsils or adenoids. What are the signs or symptoms? Common symptoms of this condition include: A feeling of fullness in the ear. Ear pain. Clicking or popping noises in the ear. Ringing in the ear (tinnitus). Hearing loss. Loss of balance. Dizziness. Symptoms may get worse when the air pressure around you changes, such as when you travel to an area of high elevation, fly on an airplane, or go scuba diving. How is this diagnosed? This condition may be diagnosed based on: Your symptoms. A physical exam of your ears, nose, and throat. Tests, such as those that measure: The movement of your eardrum. Your hearing (audiometry). How is this treated? Treatment depends on the cause and severity of your condition. In mild cases, you may relieve your symptoms by moving air into your ears. This is called popping the ears. In more severe cases, or if you have symptoms of fluid in your ears, treatment may include: Medicines to relieve congestion (decongestants). Medicines that treat allergies (antihistamines). Nasal sprays or ear drops that contain medicines that reduce swelling (steroids). A procedure to drain the fluid in your eardrum. In this procedure, a small tube may be placed in the eardrum to: Drain the fluid. Restore the air in the middle ear space. A procedure to insert a balloon device through the nose  to inflate the opening of the eustachian tube (balloon dilation). Follow these instructions at home: Lifestyle Do not do any of the following until your health care provider approves: Travel to high altitudes. Fly in airplanes. Work in a Estate agent or room. Scuba dive. Do not use any products that contain nicotine or tobacco. These products include cigarettes, chewing tobacco, and vaping devices, such as e-cigarettes. If you need help quitting, ask your health care  provider. Keep your ears dry. Wear fitted earplugs during showering and bathing. Dry your ears completely after. General instructions Take over-the-counter and prescription medicines only as told by your health care provider. Use techniques to help pop your ears as recommended by your health care provider. These may include: Chewing gum. Yawning. Frequent, forceful swallowing. Closing your mouth, holding your nose closed, and gently blowing as if you are trying to blow air out of your nose. Keep all follow-up visits. This is important. Contact a health care provider if: Your symptoms do not go away after treatment. Your symptoms come back after treatment. You are unable to pop your ears. You have: A fever. Pain in your ear. Pain in your head or neck. Fluid draining from your ear. Your hearing suddenly changes. You become very dizzy. You lose your balance. Get help right away if: You have a sudden, severe increase in any of your symptoms. Summary Eustachian tube dysfunction refers to a condition in which a blockage develops in the eustachian tube. It can be caused by ear infections, allergies, inhaled irritants, or abnormal growths in the nose or throat. Symptoms may include ear pain or fullness, hearing loss, or ringing in the ears. Mild cases are treated with techniques to unblock the ears, such as yawning or chewing gum. More severe cases are treated with medicines or procedures. This information is not intended to replace advice given to you by your health care provider. Make sure you discuss any questions you have with your health care provider. Document Revised: 06/28/2020 Document Reviewed: 06/28/2020 Elsevier Patient Education  2024 Elsevier Inc.     Signed,   Reyes Pines, MD [facility], [facility] 01/14/24 9:26 AM

## 2024-01-15 ENCOUNTER — Ambulatory Visit: Payer: Self-pay | Admitting: Family Medicine

## 2024-01-16 ENCOUNTER — Encounter (INDEPENDENT_AMBULATORY_CARE_PROVIDER_SITE_OTHER): Payer: Self-pay | Admitting: *Deleted

## 2024-01-21 NOTE — Progress Notes (Signed)
 Tried to call patient to relay lab results. Left vm to return call.

## 2024-01-22 NOTE — Progress Notes (Signed)
 Called patient to relay lab results. Left vm to return call

## 2024-01-24 NOTE — Progress Notes (Signed)
 3rd attempt to reach patient in regards to lab results. Left vm to return call. Sent letter

## 2024-02-22 ENCOUNTER — Ambulatory Visit: Admitting: Family Medicine

## 2024-05-18 ENCOUNTER — Other Ambulatory Visit: Payer: Self-pay | Admitting: Family Medicine

## 2024-05-18 DIAGNOSIS — I10 Essential (primary) hypertension: Secondary | ICD-10-CM

## 2024-05-22 ENCOUNTER — Other Ambulatory Visit: Payer: Self-pay | Admitting: Family Medicine

## 2024-05-22 DIAGNOSIS — I10 Essential (primary) hypertension: Secondary | ICD-10-CM

## 2024-05-23 ENCOUNTER — Other Ambulatory Visit: Payer: Self-pay

## 2024-05-23 DIAGNOSIS — I10 Essential (primary) hypertension: Secondary | ICD-10-CM

## 2024-05-23 MED ORDER — LOSARTAN POTASSIUM-HCTZ 100-12.5 MG PO TABS: 1.0000 | ORAL_TABLET | Freq: Every day | ORAL | 1 refills | Status: DC

## 2024-05-29 ENCOUNTER — Telehealth: Payer: Self-pay

## 2024-05-29 DIAGNOSIS — I10 Essential (primary) hypertension: Secondary | ICD-10-CM

## 2024-05-29 MED ORDER — LOSARTAN POTASSIUM 100 MG PO TABS: 100.0000 mg | ORAL_TABLET | Freq: Every day | ORAL | 0 refills | Status: DC

## 2024-05-29 MED ORDER — LOSARTAN POTASSIUM 100 MG PO TABS: 100.0000 mg | ORAL_TABLET | Freq: Every day | ORAL | 0 refills | Status: AC

## 2024-05-29 NOTE — Addendum Note (Signed)
 Addended by: Lyndle Pang R on: 05/29/2024 06:01 PM   Modules accepted: Orders

## 2024-05-29 NOTE — Telephone Encounter (Signed)
 Called patient and asked why he wanted to go back on his old regime. Patient never stopped taking Losartan -Potassium. His mom is in the medical field and his dad has a hx of HTN. They advised him not to start Losartan -HCTZ until he see's Dr. Levora again. I asked him about his readings and he said high 120's and low 130's. I also asked him about his missed appt and he said that he canceled that appt because he was not able to make it and no one reached out to him to r/s. He would like a refill of the Losartan -Potassium

## 2024-05-29 NOTE — Telephone Encounter (Signed)
 Copied from CRM #[id number]. Topic: Clinical - Medication Question >> May 29, 2024  9:36 AM Adelita E wrote: Reason for CRM: Patient calling in stating he would prefer the losartan -potassium instead of losartan -hydrochlorothiazide  (HYZAAR) 100-12.5 MG tablet. Patient has one tablet left of the losartan  potassium and would prefer that get sent into the [facility] pharmacy in [place].

## 2024-05-29 NOTE — Telephone Encounter (Signed)
 Looks like this medication was just prescribed on 05/23/2024 encounter, is there a reason he should not continue Losartan  potassium?

## 2024-05-29 NOTE — Addendum Note (Signed)
 Addended by: Reymond Maynez R on: 05/29/2024 12:59 PM   Modules accepted: Orders

## 2024-05-29 NOTE — Telephone Encounter (Signed)
 Last office visit September 15.  Borderline blood pressure at that time at 138/88, 140/95 on average on his home readings.  Decided on increasing his regimen with the addition of HCTZ, and it looks like a 1 month follow-up for hypertension recheck was recommended.  He has not been seen since that time.    If he is having lower readings or issues with the losartan  HCTZ we can certainly return to losartan  but I would like to follow-up with him in person visit.  I will refill just losartan  for now but please have him seen in the next week or two if possible, and to monitor home readings with copies of those readings at upcoming visit if possible to help guide treatment.

## 2024-05-29 NOTE — Telephone Encounter (Signed)
 That sounds reasonable, sounds like his home readings are better than last visit.  I have refilled the losartan , and I see he has an appointment coming up on February 23.  I will change the losartan  over to a 90-day supply.  Keep appointment as planned.  Thanks

## 2024-06-23 ENCOUNTER — Ambulatory Visit: Admitting: Family Medicine
